# Patient Record
Sex: Female | Born: 1960 | Race: White | Hispanic: No | Marital: Married | Smoking: Former smoker
Health system: Southern US, Community
[De-identification: ages and names within clinical notes are randomized; demographics above are authoritative.]

## PROBLEM LIST (undated history)

## (undated) DIAGNOSIS — N809 Endometriosis, unspecified: Secondary | ICD-10-CM

## (undated) DIAGNOSIS — I10 Essential (primary) hypertension: Secondary | ICD-10-CM

## (undated) DIAGNOSIS — M810 Age-related osteoporosis without current pathological fracture: Secondary | ICD-10-CM

## (undated) DIAGNOSIS — N979 Female infertility, unspecified: Secondary | ICD-10-CM

## (undated) HISTORY — PX: LAPAROSCOPY: SHX197

## (undated) HISTORY — DX: Essential (primary) hypertension: I10

## (undated) HISTORY — DX: Age-related osteoporosis without current pathological fracture: M81.0

## (undated) HISTORY — DX: Endometriosis, unspecified: N80.9

## (undated) HISTORY — DX: Female infertility, unspecified: N97.9

## (undated) HISTORY — PX: TONSILLECTOMY: SUR1361

---

## 2009-07-10 ENCOUNTER — Other Ambulatory Visit: Admission: RE | Admit: 2009-07-10 | Discharge: 2009-07-10 | Payer: Self-pay | Admitting: *Deleted

## 2009-07-17 ENCOUNTER — Encounter: Admission: RE | Admit: 2009-07-17 | Discharge: 2009-07-17 | Payer: Self-pay | Admitting: *Deleted

## 2010-07-17 ENCOUNTER — Encounter: Admission: RE | Admit: 2010-07-17 | Discharge: 2010-07-17 | Payer: Self-pay | Admitting: *Deleted

## 2010-09-17 ENCOUNTER — Other Ambulatory Visit
Admission: RE | Admit: 2010-09-17 | Discharge: 2010-09-17 | Payer: Self-pay | Source: Home / Self Care | Admitting: Internal Medicine

## 2010-11-14 ENCOUNTER — Other Ambulatory Visit: Payer: Self-pay | Admitting: Gastroenterology

## 2010-11-28 ENCOUNTER — Other Ambulatory Visit: Payer: Self-pay | Admitting: Gastroenterology

## 2011-06-10 ENCOUNTER — Other Ambulatory Visit: Payer: Self-pay | Admitting: Family Medicine

## 2011-06-10 DIAGNOSIS — Z1231 Encounter for screening mammogram for malignant neoplasm of breast: Secondary | ICD-10-CM

## 2011-06-28 ENCOUNTER — Other Ambulatory Visit: Payer: Self-pay | Admitting: Gastroenterology

## 2011-07-22 ENCOUNTER — Ambulatory Visit
Admission: RE | Admit: 2011-07-22 | Discharge: 2011-07-22 | Disposition: A | Discharge status: Home / Self Care | Payer: BC Managed Care – PPO | Source: Ambulatory Visit | Attending: Family Medicine | Admitting: Family Medicine

## 2011-07-22 DIAGNOSIS — Z1231 Encounter for screening mammogram for malignant neoplasm of breast: Secondary | ICD-10-CM

## 2012-02-05 DIAGNOSIS — H409 Unspecified glaucoma: Secondary | ICD-10-CM | POA: Insufficient documentation

## 2012-02-05 DIAGNOSIS — D126 Benign neoplasm of colon, unspecified: Secondary | ICD-10-CM | POA: Insufficient documentation

## 2012-06-23 ENCOUNTER — Other Ambulatory Visit: Payer: Self-pay | Admitting: Internal Medicine

## 2012-06-23 ENCOUNTER — Other Ambulatory Visit: Payer: Self-pay | Admitting: Family Medicine

## 2012-06-23 DIAGNOSIS — Z1231 Encounter for screening mammogram for malignant neoplasm of breast: Secondary | ICD-10-CM

## 2012-06-26 ENCOUNTER — Other Ambulatory Visit: Payer: Self-pay | Admitting: Gynecology

## 2012-06-26 DIAGNOSIS — Z780 Asymptomatic menopausal state: Secondary | ICD-10-CM

## 2012-07-22 ENCOUNTER — Ambulatory Visit
Admission: RE | Admit: 2012-07-22 | Discharge: 2012-07-22 | Disposition: A | Discharge status: Home / Self Care | Payer: BC Managed Care – PPO | Source: Ambulatory Visit | Attending: Gynecology | Admitting: Gynecology

## 2012-07-22 ENCOUNTER — Ambulatory Visit
Admission: RE | Admit: 2012-07-22 | Discharge: 2012-07-22 | Disposition: A | Discharge status: Home / Self Care | Payer: BC Managed Care – PPO | Source: Ambulatory Visit | Attending: Internal Medicine | Admitting: Internal Medicine

## 2012-07-22 ENCOUNTER — Ambulatory Visit: Payer: BC Managed Care – PPO

## 2012-07-22 DIAGNOSIS — Z1231 Encounter for screening mammogram for malignant neoplasm of breast: Secondary | ICD-10-CM

## 2012-07-22 DIAGNOSIS — Z780 Asymptomatic menopausal state: Secondary | ICD-10-CM

## 2012-07-27 ENCOUNTER — Other Ambulatory Visit: Payer: Self-pay | Admitting: Internal Medicine

## 2012-07-27 DIAGNOSIS — R928 Other abnormal and inconclusive findings on diagnostic imaging of breast: Secondary | ICD-10-CM

## 2012-07-29 ENCOUNTER — Ambulatory Visit
Admission: RE | Admit: 2012-07-29 | Discharge: 2012-07-29 | Disposition: A | Discharge status: Home / Self Care | Payer: BC Managed Care – PPO | Source: Ambulatory Visit | Attending: Internal Medicine | Admitting: Internal Medicine

## 2012-07-29 DIAGNOSIS — R928 Other abnormal and inconclusive findings on diagnostic imaging of breast: Secondary | ICD-10-CM

## 2012-08-07 DIAGNOSIS — M810 Age-related osteoporosis without current pathological fracture: Secondary | ICD-10-CM | POA: Insufficient documentation

## 2012-08-07 DIAGNOSIS — E785 Hyperlipidemia, unspecified: Secondary | ICD-10-CM | POA: Insufficient documentation

## 2012-12-22 ENCOUNTER — Other Ambulatory Visit: Payer: Self-pay | Admitting: Internal Medicine

## 2012-12-22 DIAGNOSIS — N63 Unspecified lump in unspecified breast: Secondary | ICD-10-CM

## 2012-12-22 DIAGNOSIS — N6489 Other specified disorders of breast: Secondary | ICD-10-CM

## 2012-12-31 ENCOUNTER — Telehealth: Payer: Self-pay | Admitting: Gynecology

## 2012-12-31 NOTE — Telephone Encounter (Signed)
pt having trouble with Fosamax upsetting her stomach/please advise/[place]

## 2013-01-01 NOTE — Telephone Encounter (Signed)
See note below. Please advise.  

## 2013-01-04 NOTE — Telephone Encounter (Signed)
Please pull chart, not in my office

## 2013-01-04 NOTE — Telephone Encounter (Signed)
CHART IN YOUR OFFICE/ SORRY/ SUE

## 2013-01-05 NOTE — Telephone Encounter (Signed)
Patient notified of Dr. Farrel Gobble instructions for vitamin D and Ca. Patient understands and agrees with this. sue

## 2013-01-05 NOTE — Telephone Encounter (Signed)
Reviewed chart, pt on fossamax based on forearm reading, osteopenic in spine and normal in hip, I suggest she discontinue rx and be more compliant with vit D and Ca, we can repeat DEXA in 2y to see if she is stable or loosing ground, as she is postmenopausal for 10y, she should be stable

## 2013-01-05 NOTE — Telephone Encounter (Signed)
Left message to return call to office. sue 

## 2013-01-19 ENCOUNTER — Ambulatory Visit
Admission: RE | Admit: 2013-01-19 | Discharge: 2013-01-19 | Disposition: A | Discharge status: Home / Self Care | Payer: BC Managed Care – PPO | Source: Ambulatory Visit | Attending: Internal Medicine | Admitting: Internal Medicine

## 2013-01-19 ENCOUNTER — Other Ambulatory Visit: Payer: Self-pay | Admitting: Internal Medicine

## 2013-01-19 DIAGNOSIS — N6489 Other specified disorders of breast: Secondary | ICD-10-CM

## 2013-01-19 DIAGNOSIS — N63 Unspecified lump in unspecified breast: Secondary | ICD-10-CM

## 2013-06-17 ENCOUNTER — Other Ambulatory Visit: Payer: Self-pay

## 2013-06-17 DIAGNOSIS — Z1231 Encounter for screening mammogram for malignant neoplasm of breast: Secondary | ICD-10-CM

## 2013-06-28 ENCOUNTER — Other Ambulatory Visit: Payer: Self-pay

## 2013-06-28 MED ORDER — ESTRADIOL 10 MCG VA TABS: 1.0000 | ORAL_TABLET | VAGINAL | Status: DC

## 2013-06-28 NOTE — Telephone Encounter (Signed)
Refill fax came in from [facility] on [address].Pt scheduled aex for 07/12/13. rx sent to pharmacy

## 2013-07-12 ENCOUNTER — Ambulatory Visit: Payer: Self-pay | Admitting: Gynecology

## 2013-07-14 ENCOUNTER — Ambulatory Visit (INDEPENDENT_AMBULATORY_CARE_PROVIDER_SITE_OTHER): Payer: BC Managed Care – PPO | Admitting: Gynecology

## 2013-07-14 ENCOUNTER — Encounter: Payer: Self-pay | Admitting: Gynecology

## 2013-07-14 VITALS — BP 102/60 | HR 68 | Resp 16 | Ht 63.0 in | Wt 153.0 lb

## 2013-07-14 DIAGNOSIS — I10 Essential (primary) hypertension: Secondary | ICD-10-CM | POA: Insufficient documentation

## 2013-07-14 DIAGNOSIS — M810 Age-related osteoporosis without current pathological fracture: Secondary | ICD-10-CM | POA: Insufficient documentation

## 2013-07-14 DIAGNOSIS — Z01419 Encounter for gynecological examination (general) (routine) without abnormal findings: Secondary | ICD-10-CM

## 2013-07-14 DIAGNOSIS — Z0000 Encounter for general adult medical examination without abnormal findings: Secondary | ICD-10-CM

## 2013-07-14 DIAGNOSIS — N952 Postmenopausal atrophic vaginitis: Secondary | ICD-10-CM

## 2013-07-14 LAB — POCT URINALYSIS DIPSTICK
Bilirubin, UA: NEGATIVE
Blood, UA: NEGATIVE
Glucose, UA: NEGATIVE
Ketones, UA: NEGATIVE
Leukocytes, UA: NEGATIVE
Nitrite, UA: NEGATIVE
Protein, UA: NEGATIVE
Urobilinogen, UA: NEGATIVE
pH, UA: 5

## 2013-07-14 MED ORDER — ESTRADIOL 10 MCG VA TABS: 1.0000 | ORAL_TABLET | VAGINAL | Status: DC

## 2013-07-14 NOTE — Progress Notes (Signed)
52 y.o. Married Caucasian female   No obstetric history on file. here for annual exam. Pt reports menses are absent.  She does not report hot flashes, does have night sweats, does have vaginal dryness.  She is using lubricants, Vagifem.  She does not report post-menopasual bleeding.  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  Is using vagifem, works well.  Patient's last menstrual period was 09/30/2002.          Sexually active: yes  The current method of family planning is none.    Exercising: yes  tennis, run, jog, walk, hike, trainer Last pap:06-26-12 neg HPV HR neg Abnormal PAP: none Mammogram :12/2012 u/s rt breast f/u in 10/14 BSE: done monthly Colonoscopy: 2011 DEXA: 2013 Alcohol: 10 a week wine Tobacco: none, smoker in the past Labs: Hgb-12.6, Poct urine-neg  Health Maintenance  Topic Date Due  . Pap Smear  12/04/1977  . Tetanus/tdap  12/05/1978  . Colonoscopy  12/04/2009  . Influenza Vaccine  04/30/2013  . Mammogram  01/20/2015    Family History  Problem Relation Age of Onset  . Hypertension Mother   . Hypertension Father   . Stroke Father   . Hypertension Brother   . Hypertension Brother   . Hypertension Brother     There are no active problems to display for this patient.   Past Medical History  Diagnosis Date  . Hypertension   . Endometriosis   . Infertility, female   . Osteopenia   . Osteoporosis     Past Surgical History  Procedure Laterality Date  . Laparoscopy      Allergies: Review of patient's allergies indicates no known allergies.  Current Outpatient Prescriptions  Medication Sig Dispense Refill  . amLODipine (NORVASC) 2.5 MG tablet daily.       Marland Kitchen CALCIUM PO Take by mouth daily. Plus vitamin d      . Estradiol 10 MCG TABS vaginal tablet Place 1 tablet (10 mcg total) vaginally 2 (two) times a week.  8 tablet  0  . losartan-hydrochlorothiazide (HYZAAR) 100-12.5 MG per  tablet       . Omega-3 Fatty Acids (FISH OIL PO) Take by mouth daily.       . TRANSDERM-SCOP 1.5 MG 1 patch as needed.        No current facility-administered medications for this visit.    ROS: Pertinent items are noted in HPI.  Exam:    BP 102/60  Pulse 68  Resp 16  Ht 5\' 3"  (1.6 m)  Wt 153 lb (69.4 kg)  BMI 27.11 kg/m2  LMP 09/30/2002 Weight change: @WEIGHTCHANGE @ Last 3 height recordings:  Ht Readings from Last 3 Encounters:  07/14/13 5\' 3"  (1.6 m)   General appearance: alert, cooperative and appears stated age Head: Normocephalic, without obvious abnormality, atraumatic Neck: no adenopathy, no carotid bruit, no JVD, supple, symmetrical, trachea midline and thyroid not enlarged, symmetric, no tenderness/mass/nodules Lungs: clear to auscultation bilaterally Breasts: normal appearance, no masses or tenderness Heart: regular rate and rhythm, S1, S2 normal, no murmur, click, rub or gallop Abdomen: soft, non-tender; bowel sounds normal; no masses,  no organomegaly Extremities: extremities normal, atraumatic, no cyanosis or edema Skin: Skin color, texture, turgor normal. No rashes or lesions Lymph nodes: Cervical, supraclavicular, and axillary nodes normal. no inguinal nodes palpated Neurologic: Grossly normal   Pelvic: External genitalia:  no lesions  Urethra: normal appearing urethra with no masses, tenderness or lesions              Bartholins and Skenes: normal                 Vagina: normal appearing vagina with normal color and discharge, no lesions              Cervix: normal appearance              Pap taken: no        Bimanual Exam:  Uterus:  uterus is normal size, shape, consistency and nontender, retroverted with fundal fibroid                                      Adnexa:    normal adnexa in size, nontender and no masses                                      Rectovaginal: Confirms                                      Anus:  normal sphincter tone, no  lesions  A: well woman Osteoporosis Atrophic vaginitis     P: mammogram due 06/2013 pap smear guidelines reviewed, not done today  refill vagifem return annually or prn Discussed PAP guideline changes, importance of weight bearing exercises, calcium, vit D and balanced diet.  An After Visit Summary was printed and given to the patient.

## 2013-07-14 NOTE — Patient Instructions (Signed)

## 2013-07-27 ENCOUNTER — Ambulatory Visit: Payer: BC Managed Care – PPO

## 2013-08-04 ENCOUNTER — Ambulatory Visit
Admission: RE | Admit: 2013-08-04 | Discharge: 2013-08-04 | Disposition: A | Discharge status: Home / Self Care | Payer: BC Managed Care – PPO | Source: Ambulatory Visit

## 2013-08-04 DIAGNOSIS — Z1231 Encounter for screening mammogram for malignant neoplasm of breast: Secondary | ICD-10-CM

## 2013-08-09 ENCOUNTER — Ambulatory Visit: Payer: BC Managed Care – PPO

## 2013-08-19 ENCOUNTER — Ambulatory Visit: Payer: BC Managed Care – PPO

## 2014-05-31 DIAGNOSIS — Z0289 Encounter for other administrative examinations: Secondary | ICD-10-CM

## 2014-07-06 ENCOUNTER — Other Ambulatory Visit: Payer: Self-pay

## 2014-07-06 DIAGNOSIS — Z1239 Encounter for other screening for malignant neoplasm of breast: Secondary | ICD-10-CM

## 2014-07-15 ENCOUNTER — Encounter: Payer: Self-pay | Admitting: Gynecology

## 2014-07-15 ENCOUNTER — Ambulatory Visit (INDEPENDENT_AMBULATORY_CARE_PROVIDER_SITE_OTHER): Payer: BC Managed Care – PPO | Admitting: Gynecology

## 2014-07-15 VITALS — BP 120/64 | Resp 16 | Ht 63.0 in | Wt 156.0 lb

## 2014-07-15 DIAGNOSIS — Z13220 Encounter for screening for lipoid disorders: Secondary | ICD-10-CM

## 2014-07-15 DIAGNOSIS — N952 Postmenopausal atrophic vaginitis: Secondary | ICD-10-CM

## 2014-07-15 DIAGNOSIS — Z0000 Encounter for general adult medical examination without abnormal findings: Secondary | ICD-10-CM

## 2014-07-15 DIAGNOSIS — I10 Essential (primary) hypertension: Secondary | ICD-10-CM

## 2014-07-15 DIAGNOSIS — Z01419 Encounter for gynecological examination (general) (routine) without abnormal findings: Secondary | ICD-10-CM

## 2014-07-15 LAB — POCT URINALYSIS DIPSTICK
Leukocytes, UA: NEGATIVE
Urobilinogen, UA: NEGATIVE
pH, UA: 5

## 2014-07-15 NOTE — Progress Notes (Signed)
53 y.o. Married Caucasian female   G0P0000 here for annual exam. Pt reports menses are absent due to Menopause.  She does not report hot flashes, does have night sweats, does have vaginal dryness.  She is using lubricants, Replens .  She does not report post-menopasual bleeding.    Patient's last menstrual period was 09/30/2002.          Sexually active: Yes.    The current method of family planning is post menopausal status.    Exercising: No.  The patient does not participate in regular exercise at present. Last pap: 06/26/12 NEG HR HPV not indicated. Abnormal PAP: no Mammogram: 08/04/13 ; scheduled for 08/05/14  BSE: yes  Colonoscopy: 3 years ago- dysplastic polyp f/u in 3 years  DEXA: 2014; 2013 per patient  Alcohol: Tobacco: wine couple x's/wk   Urine: Negative  Health Maintenance  Topic Date Due  . Tetanus/tdap  12/05/1978  . Colonoscopy  12/04/2009  . Influenza Vaccine  04/30/2014  . Mammogram  08/05/2015  . Pap Smear  07/14/2016    Family History  Problem Relation Age of Onset  . Hypertension Mother   . Hypertension Father   . Stroke Father   . Hypertension Brother   . Hypertension Brother   . Hypertension Brother     Patient Active Problem List   Diagnosis Date Noted  . Essential hypertension, benign 07/14/2013  . Osteoporosis, unspecified 07/14/2013    Past Medical History  Diagnosis Date  . Hypertension   . Endometriosis   . Infertility, female   . Osteopenia   . Osteoporosis     Past Surgical History  Procedure Laterality Date  . Laparoscopy      Allergies: Review of patient's allergies indicates no known allergies.  Current Outpatient Prescriptions  Medication Sig Dispense Refill  . amLODipine (NORVASC) 2.5 MG tablet daily.       Marland Kitchen CALCIUM PO Take by mouth daily. Plus vitamin d      . losartan-hydrochlorothiazide (HYZAAR) 100-12.5 MG per tablet       . Omega-3 Fatty Acids (FISH OIL PO) Take by mouth daily.       . TRANSDERM-SCOP 1.5 MG 1  patch as needed.       . Estradiol 10 MCG TABS vaginal tablet Place 1 tablet (10 mcg total) vaginally 2 (two) times a week.  24 tablet  3   No current facility-administered medications for this visit.    ROS: per HPI  Exam:    BP 120/64  Resp 16  Ht 5\' 3"  (1.6 m)  Wt 156 lb (70.761 kg)  BMI 27.64 kg/m2  LMP 09/30/2002 Weight change: @WEIGHTCHANGE @ Last 3 height recordings:  Ht Readings from Last 3 Encounters:  07/15/14 5\' 3"  (1.6 m)  07/14/13 5\' 3"  (1.6 m)   General appearance: alert, cooperative and appears stated age Head: Normocephalic, without obvious abnormality, atraumatic Neck: no adenopathy, no carotid bruit, no JVD, supple, symmetrical, trachea midline and thyroid not enlarged, symmetric, no tenderness/mass/nodules Lungs: clear to auscultation bilaterally Breasts: Inspection negative, No nipple retraction or dimpling, No nipple discharge or bleeding, No axillary or supraclavicular adenopathy, Normal to palpation without dominant masses Heart: regular rate and rhythm, S1, S2 normal, no murmur, click, rub or gallop Abdomen: soft, non-tender; bowel sounds normal; no masses,  no organomegaly Extremities: extremities normal, atraumatic, no cyanosis or edema Skin: Skin color, texture, turgor normal. No rashes or lesions Lymph nodes: Cervical, supraclavicular, and axillary nodes normal. no inguinal nodes palpated Neurologic: Grossly normal  Pelvic: External genitalia:  no lesions              Urethra: normal appearing urethra with no masses, tenderness or lesions              Bartholins and Skenes: Bartholin's, Urethra, Skene's normal                 Vagina: normal appearing vagina with normal color and discharge, no lesions              Cervix: normal appearance              Pap taken: No.        Bimanual Exam:  Uterus:  uterus is normal size, shape, consistency and nontender                                      Adnexa:    normal adnexa in size, nontender and no masses                                       Rectovaginal: Confirms                                      Anus:  normal sphincter tone, no lesions      1. Laboratory exam ordered as part of routine general medical examination  - POCT Urinalysis Dipstick - CBC; Future  2. Encounter for routine gynecological examination mammogram counseled on breast self exam, mammography screening, osteoporosis, adequate intake of calcium and vitamin D, diet and exercise return annually or prn Discussed PAP guideline changes, importance of weight bearing exercises, calcium, vit D and balanced diet.  3. Atrophic vaginitis Pt inconsistent with vagifem, might not be adequately primed enough to absorb, at this time prefers to continue with cocoanut oil  4. Essential hypertension, benign Prefers labs here so can discuss with PCP at ov - Comprehensive metabolic panel; Future  5. Screening cholesterol level  - Lipid panel; Future  An After Visit Summary was printed and given to the patient.

## 2014-07-18 ENCOUNTER — Other Ambulatory Visit (INDEPENDENT_AMBULATORY_CARE_PROVIDER_SITE_OTHER): Payer: BC Managed Care – PPO

## 2014-07-18 DIAGNOSIS — Z13220 Encounter for screening for lipoid disorders: Secondary | ICD-10-CM

## 2014-07-18 DIAGNOSIS — I10 Essential (primary) hypertension: Secondary | ICD-10-CM

## 2014-07-18 DIAGNOSIS — Z0000 Encounter for general adult medical examination without abnormal findings: Secondary | ICD-10-CM

## 2014-07-18 LAB — COMPREHENSIVE METABOLIC PANEL
ALT: 19 U/L (ref 0–35)
AST: 21 U/L (ref 0–37)
Albumin: 4.8 g/dL (ref 3.5–5.2)
Alkaline Phosphatase: 53 U/L (ref 39–117)
BUN: 12 mg/dL (ref 6–23)
CHLORIDE: 104 meq/L (ref 96–112)
CO2: 27 mEq/L (ref 19–32)
Calcium: 9.4 mg/dL (ref 8.4–10.5)
Creat: 0.71 mg/dL (ref 0.50–1.10)
Glucose, Bld: 94 mg/dL (ref 70–99)
Potassium: 4.3 mEq/L (ref 3.5–5.3)
Sodium: 141 mEq/L (ref 135–145)
Total Bilirubin: 0.5 mg/dL (ref 0.2–1.2)
Total Protein: 6.8 g/dL (ref 6.0–8.3)

## 2014-07-18 LAB — CBC
HCT: 39.8 % (ref 36.0–46.0)
Hemoglobin: 13.7 g/dL (ref 12.0–15.0)
MCH: 32.1 pg (ref 26.0–34.0)
MCHC: 34.4 g/dL (ref 30.0–36.0)
MCV: 93.2 fL (ref 78.0–100.0)
Platelets: 228 10*3/uL (ref 150–400)
RBC: 4.27 MIL/uL (ref 3.87–5.11)
RDW: 12.4 % (ref 11.5–15.5)
WBC: 3.9 10*3/uL — ABNORMAL LOW (ref 4.0–10.5)

## 2014-07-18 LAB — LIPID PANEL
CHOL/HDL RATIO: 2.8 ratio
Cholesterol: 270 mg/dL — ABNORMAL HIGH (ref 0–200)
HDL: 98 mg/dL (ref >39)
LDL Cholesterol: 149 mg/dL — ABNORMAL HIGH (ref 0–99)
Triglycerides: 115 mg/dL (ref <150)
VLDL: 23 mg/dL (ref 0–40)

## 2014-07-20 ENCOUNTER — Telehealth: Payer: Self-pay | Admitting: *Deleted

## 2014-07-20 NOTE — Telephone Encounter (Signed)
Patient notified see result note 

## 2014-07-20 NOTE — Telephone Encounter (Signed)
Message copied by Alfonzo Feller on Wed Jul 20, 2014  9:54 AM ------      Message from: Elveria Rising      Created: Wed Jul 20, 2014  7:14 AM       Labs done for PCP, pls see if pt needs printed copy.      Cholesterol is high-total and LDL, HDL is great, ratio is good ------

## 2014-07-20 NOTE — Telephone Encounter (Signed)
Pt called Monique Brady back

## 2014-07-20 NOTE — Telephone Encounter (Signed)
Left Message To Call Back  

## 2014-08-01 ENCOUNTER — Encounter: Payer: Self-pay | Admitting: Gynecology

## 2014-08-05 ENCOUNTER — Ambulatory Visit
Admission: RE | Admit: 2014-08-05 | Discharge: 2014-08-05 | Disposition: A | Discharge status: Home / Self Care | Payer: BC Managed Care – PPO | Source: Ambulatory Visit

## 2014-08-05 ENCOUNTER — Other Ambulatory Visit: Payer: Self-pay

## 2014-08-05 DIAGNOSIS — Z1231 Encounter for screening mammogram for malignant neoplasm of breast: Secondary | ICD-10-CM

## 2014-08-30 ENCOUNTER — Other Ambulatory Visit: Payer: Self-pay | Admitting: Internal Medicine

## 2014-08-30 DIAGNOSIS — M810 Age-related osteoporosis without current pathological fracture: Secondary | ICD-10-CM

## 2014-10-06 ENCOUNTER — Ambulatory Visit
Admission: RE | Admit: 2014-10-06 | Discharge: 2014-10-06 | Disposition: A | Discharge status: Home / Self Care | Payer: Self-pay | Source: Ambulatory Visit | Attending: Internal Medicine | Admitting: Internal Medicine

## 2014-10-06 DIAGNOSIS — M810 Age-related osteoporosis without current pathological fracture: Secondary | ICD-10-CM

## 2014-10-07 ENCOUNTER — Other Ambulatory Visit: Payer: BC Managed Care – PPO

## 2014-10-19 ENCOUNTER — Other Ambulatory Visit: Payer: Self-pay | Admitting: Gastroenterology

## 2015-07-04 ENCOUNTER — Other Ambulatory Visit: Payer: Self-pay

## 2015-07-04 DIAGNOSIS — Z1231 Encounter for screening mammogram for malignant neoplasm of breast: Secondary | ICD-10-CM

## 2015-08-07 ENCOUNTER — Ambulatory Visit
Admission: RE | Admit: 2015-08-07 | Discharge: 2015-08-07 | Disposition: A | Discharge status: Home / Self Care | Payer: BLUE CROSS/BLUE SHIELD | Source: Ambulatory Visit

## 2015-08-07 DIAGNOSIS — Z1231 Encounter for screening mammogram for malignant neoplasm of breast: Secondary | ICD-10-CM

## 2015-08-17 ENCOUNTER — Ambulatory Visit (INDEPENDENT_AMBULATORY_CARE_PROVIDER_SITE_OTHER): Payer: BLUE CROSS/BLUE SHIELD | Admitting: Obstetrics and Gynecology

## 2015-08-17 ENCOUNTER — Encounter: Payer: Self-pay | Admitting: Obstetrics and Gynecology

## 2015-08-17 VITALS — BP 136/74 | HR 80 | Resp 14 | Ht 63.0 in | Wt 159.0 lb

## 2015-08-17 DIAGNOSIS — Z124 Encounter for screening for malignant neoplasm of cervix: Secondary | ICD-10-CM | POA: Diagnosis not present

## 2015-08-17 DIAGNOSIS — Z01419 Encounter for gynecological examination (general) (routine) without abnormal findings: Secondary | ICD-10-CM

## 2015-08-17 DIAGNOSIS — M810 Age-related osteoporosis without current pathological fracture: Secondary | ICD-10-CM | POA: Diagnosis not present

## 2015-08-17 DIAGNOSIS — N952 Postmenopausal atrophic vaginitis: Secondary | ICD-10-CM | POA: Diagnosis not present

## 2015-08-17 MED ORDER — ESTRADIOL 10 MCG VA TABS: ORAL_TABLET | VAGINAL | Status: DC

## 2015-08-17 NOTE — Patient Instructions (Signed)

## 2015-08-17 NOTE — Progress Notes (Signed)
Patient ID: Monique Brady, female   DOB: January 17, 1961, 54 y.o.   MRN: [id number] 54 y.o. G0P0000 MarriedCaucasianF here for annual exam.  Postmenopausal, no bleeding. Sexually active, no longer using vagifem, wants to restart it. She does have some burning with intercourse even with lubrication.  The patient has osteoporosis, restarted on medication this year (had been on fosomax in the past). Labs with primary MD.    Patient's last menstrual period was 09/30/2002.          Sexually active: Yes.    The current method of family planning is post menopausal status.    Exercising: Yes.    run, walk, weight training and tennis Smoker: former  Health Maintenance: Pap:  09-17-10 WNL  History of abnormal Pap:  no MMG:  08-17-15 WNL Colonoscopy:  2015 repeat in 5 years  BMD:   10-06-14 osteoporosis (followed by primary) TDaP:  Unsure, she will check with her primary Gardasil: N/A   reports that she has quit smoking. She has never used smokeless tobacco. She reports that she drinks about 4.8 oz of alcohol per week. She reports that she does not use illicit drugs.2 adopted daughters from [place], traveled a lot with her husbands work, has lived in several different countries.  Daughters are 82 and 32, both in college.   Past Medical History  Diagnosis Date  . Hypertension   . Endometriosis   . Infertility, female   . Osteopenia   . Osteoporosis     Past Surgical History  Procedure Laterality Date  . Laparoscopy      Current Outpatient Prescriptions  Medication Sig Dispense Refill  . amLODipine (NORVASC) 2.5 MG tablet daily.     Marland Kitchen CALCIUM PO Take by mouth daily. Plus vitamin d    . Estradiol 10 MCG TABS vaginal tablet Place 1 tablet (10 mcg total) vaginally 2 (two) times a week. 24 tablet 3  . losartan-hydrochlorothiazide (HYZAAR) 100-12.5 MG per tablet     . Omega-3 Fatty Acids (FISH OIL PO) Take by mouth daily.     . TRANSDERM-SCOP 1.5 MG 1 patch as needed.      No current  facility-administered medications for this visit.    Family History  Problem Relation Age of Onset  . Hypertension Mother   . Hypertension Father   . Stroke Father   . Hypertension Brother   . Hypertension Brother   . Hypertension Brother     Review of Systems  Constitutional: Negative.   HENT: Negative.   Eyes: Negative.   Respiratory: Negative.   Cardiovascular: Negative.   Gastrointestinal: Negative.   Endocrine: Negative.   Genitourinary: Negative.        Vaginal dryness   Musculoskeletal: Negative.   Skin: Negative.   Allergic/Immunologic: Negative.   Neurological: Negative.   Psychiatric/Behavioral: Negative.     Exam:   BP 136/74 mmHg  Pulse 80  Resp 14  Ht 5\' 3"  (1.6 m)  Wt 159 lb (72.122 kg)  BMI 28.17 kg/m2  LMP 09/30/2002  Weight change: @WEIGHTCHANGE @ Height:   Height: 5\' 3"  (160 cm)  Ht Readings from Last 3 Encounters:  08/17/15 5\' 3"  (1.6 m)  07/15/14 5\' 3"  (1.6 m)  07/14/13 5\' 3"  (1.6 m)    General appearance: alert, cooperative and appears stated age Head: Normocephalic, without obvious abnormality, atraumatic Neck: no adenopathy, supple, symmetrical, trachea midline and thyroid normal to inspection and palpation Lungs: clear to auscultation bilaterally Breasts: normal appearance, no masses or tenderness Heart: regular rate and  rhythm Abdomen: soft, non-tender; bowel sounds normal; no masses,  no organomegaly Extremities: extremities normal, atraumatic, no cyanosis or edema Skin: Skin color, texture, turgor normal. No rashes or lesions Lymph nodes: Cervical, supraclavicular, and axillary nodes normal. No abnormal inguinal nodes palpated Neurologic: Grossly normal   Pelvic: External genitalia:  no lesions              Urethra:  normal appearing urethra with no masses, tenderness or lesions              Bartholins and Skenes: normal                 Vagina: normal appearing vagina with normal color and discharge, no lesions. Atrophic.               Cervix: no lesions               Bimanual Exam:  Uterus:  normal size, contour, position, consistency, mobility, non-tender              Adnexa: no mass, fullness, tenderness               Rectovaginal: Confirms               Anus:  normal sphincter tone, no lesions  Chaperone was present for exam.  A:  Well Woman with normal exam  Vaginal atrophy with some dyspareunia  Osteoporosis, on medication, she will call with what she is on  P:   Pap with hpv testing  Mammogram, just done at the [facility] center  Labs with primary  DEXA and colonoscopy UTD  Discussed calcium and vit d  Discussed breast self exam

## 2015-08-21 LAB — IPS PAP TEST WITH HPV

## 2015-10-12 ENCOUNTER — Encounter (HOSPITAL_COMMUNITY): Payer: Self-pay | Admitting: *Deleted

## 2015-10-12 ENCOUNTER — Emergency Department (HOSPITAL_COMMUNITY): Payer: BLUE CROSS/BLUE SHIELD

## 2015-10-12 ENCOUNTER — Emergency Department (HOSPITAL_COMMUNITY)
Admission: EM | Admit: 2015-10-12 | Discharge: 2015-10-12 | Disposition: A | Discharge status: Home / Self Care | Payer: BLUE CROSS/BLUE SHIELD | Attending: Emergency Medicine | Admitting: Emergency Medicine

## 2015-10-12 DIAGNOSIS — S3992XA Unspecified injury of lower back, initial encounter: Secondary | ICD-10-CM | POA: Diagnosis present

## 2015-10-12 DIAGNOSIS — M810 Age-related osteoporosis without current pathological fracture: Secondary | ICD-10-CM | POA: Diagnosis not present

## 2015-10-12 DIAGNOSIS — I10 Essential (primary) hypertension: Secondary | ICD-10-CM | POA: Insufficient documentation

## 2015-10-12 DIAGNOSIS — Y9301 Activity, walking, marching and hiking: Secondary | ICD-10-CM | POA: Insufficient documentation

## 2015-10-12 DIAGNOSIS — Y9289 Other specified places as the place of occurrence of the external cause: Secondary | ICD-10-CM | POA: Insufficient documentation

## 2015-10-12 DIAGNOSIS — Y999 Unspecified external cause status: Secondary | ICD-10-CM | POA: Diagnosis not present

## 2015-10-12 DIAGNOSIS — Z79899 Other long term (current) drug therapy: Secondary | ICD-10-CM | POA: Diagnosis not present

## 2015-10-12 DIAGNOSIS — Z87891 Personal history of nicotine dependence: Secondary | ICD-10-CM | POA: Insufficient documentation

## 2015-10-12 DIAGNOSIS — S30810A Abrasion of lower back and pelvis, initial encounter: Secondary | ICD-10-CM | POA: Insufficient documentation

## 2015-10-12 DIAGNOSIS — W109XXA Fall (on) (from) unspecified stairs and steps, initial encounter: Secondary | ICD-10-CM | POA: Diagnosis not present

## 2015-10-12 DIAGNOSIS — Z8742 Personal history of other diseases of the female genital tract: Secondary | ICD-10-CM | POA: Diagnosis not present

## 2015-10-12 DIAGNOSIS — M545 Low back pain: Secondary | ICD-10-CM

## 2015-10-12 MED ORDER — ONDANSETRON HCL 4 MG PO TABS
4.0000 mg | ORAL_TABLET | Freq: Once | ORAL | Status: AC
  Administered 2015-10-12: 4 mg via ORAL
  Filled 2015-10-12: qty 1

## 2015-10-12 MED ORDER — HYDROCODONE-ACETAMINOPHEN 5-325 MG PO TABS
1.0000 | ORAL_TABLET | Freq: Once | ORAL | Status: AC
  Administered 2015-10-12: 1 via ORAL
  Filled 2015-10-12: qty 1

## 2015-10-12 MED ORDER — CYCLOBENZAPRINE HCL 10 MG PO TABS: 10.0000 mg | ORAL_TABLET | Freq: Two times a day (BID) | ORAL | Status: DC | PRN

## 2015-10-12 NOTE — Discharge Instructions (Signed)
Please read and follow all provided instructions.  Your diagnoses today include:  1. Low back pain, unspecified back pain laterality, with sciatica presence unspecified   2. Abrasion of lower back, initial encounter    Tests performed today include:  Vital signs - see below for your results today  Medications prescribed:   Take any prescribed medications only as directed.  Home care instructions:   Follow any educational materials contained in this packet  Please rest, use ice or heat on your back for the next several days  Do not lift, push, pull anything more than 10 pounds for the next week  Follow-up instructions: Please follow-up with your primary care provider in the next 1 week for further evaluation of your symptoms.   Return instructions:  SEEK IMMEDIATE MEDICAL ATTENTION IF YOU HAVE:  New numbness, tingling, weakness, or problem with the use of your arms or legs  Severe back pain not relieved with medications  Loss control of your bowels or bladder  Increasing pain in any areas of the body (such as chest or abdominal pain)  Shortness of breath, dizziness, or fainting.   Worsening nausea (feeling sick to your stomach), vomiting, fever, or sweats  Any other emergent concerns regarding your health   Additional Information:  Your vital signs today were: BP 126/85 mmHg   Pulse 76   Temp(Src) 98.2 F (36.8 C) (Oral)   Resp 20   SpO2 100%   LMP 09/30/2002 If your blood pressure (BP) was elevated above 135/85 this visit, please have this repeated by your doctor within one month. --------------

## 2015-10-12 NOTE — ED Provider Notes (Signed)
CSN: [id number]     Arrival date & time 10/12/15  [id number] History   First MD Initiated Contact with Patient 10/12/15 0802     Chief Complaint  Patient presents with  . Fall   (Consider location/radiation/quality/duration/timing/severity/associated sxs/prior Treatment) HPI 55 y.o. female with pmh HTN, presents to the Emergency Department today complaining of low back pain after a fall last night. States that she was walking down the stairs when she slipped and landed on her lumbar spine. She did no hit her head/No LOC. She was able to ambulate afterwards. This morning she took some advil due to continued pain and also felt a little nauseated and diaphoretic. Decided to come to the ED to be evaluated. No N/V, no fevers, no abd pain, no dyspnea, no loss of bowel or bladder function, no headache.   No blood thinners  Past Medical History  Diagnosis Date  . Hypertension   . Endometriosis   . Infertility, female   . Osteopenia   . Osteoporosis    Past Surgical History  Procedure Laterality Date  . Laparoscopy     Family History  Problem Relation Age of Onset  . Hypertension Mother   . Hypertension Father   . Stroke Father   . Hypertension Brother   . Hypertension Brother   . Hypertension Brother    Social History  Substance Use Topics  . Smoking status: Former Research scientist (life sciences)  . Smokeless tobacco: Never Used  . Alcohol Use: 4.8 oz/week    8 Glasses of wine per week   OB History    Gravida Para Term Preterm AB TAB SAB Ectopic Multiple Living   0 0 0 0 0 0 0 0 0 0       Obstetric Comments   2 adopted girls     Review of Systems 10 Systems reviewed and all are negative for acute change except as noted in the HPI.  Allergies  Review of patient's allergies indicates no known allergies.  Home Medications   Prior to Admission medications   Medication Sig Start Date End Date Taking? Authorizing Provider  amLODipine (NORVASC) 2.5 MG tablet daily.  06/13/13   Historical Provider, MD   CALCIUM PO Take by mouth daily. Plus vitamin d    Historical Provider, MD  Estradiol 10 MCG TABS vaginal tablet 1 tab per vagina every night x 1 week, then 2 x a week at hs 08/17/15   Salvadore Dom, MD  losartan-hydrochlorothiazide [facility]) 100-12.5 MG per tablet  06/13/13   Historical Provider, MD  Omega-3 Fatty Acids (FISH OIL PO) Take by mouth daily.     Historical Provider, MD  TRANSDERM-SCOP 1.5 MG 1 patch as needed.  07/05/13   Historical Provider, MD   BP 126/85 mmHg  Pulse 76  Temp(Src) 98.2 F (36.8 C) (Oral)  Resp 20  SpO2 100%  LMP 09/30/2002   Physical Exam  Constitutional: She is oriented to person, place, and time. She appears well-developed and well-nourished.  HENT:  Head: Normocephalic and atraumatic.  Eyes: EOM are normal.  Cardiovascular: Normal rate and regular rhythm.   Pulmonary/Chest: Effort normal.  Abdominal: Soft.  Musculoskeletal: Normal range of motion.       Cervical back: Normal.       Thoracic back: Normal.       Lumbar back: She exhibits tenderness and pain. She exhibits normal range of motion and no swelling.  Tenderness on palpation of Lumbar spine. Full ROM. Abrasion noted over L4-L5.   Neurological: She is  alert and oriented to person, place, and time.  Skin: Skin is warm and dry.  Psychiatric: She has a normal mood and affect. Her behavior is normal. Thought content normal.  Nursing note and vitals reviewed.  ED Course  Procedures (including critical care time) Labs Review Labs Reviewed - No data to display  Imaging Review Dg Lumbar Spine Complete  10/12/2015  CLINICAL DATA:  Back pain, fell on ice last night, sharp RIGHT-side lower back pain and RIGHT pelvic pain EXAM: LUMBAR SPINE - COMPLETE 4+ VIEW COMPARISON:  None FINDINGS: Five non-rib-bearing lumbar vertebra. Osseous mineralization normal for technique. Vertebral body and disc space heights maintained. No acute fracture, subluxation or bone destruction. No spondylolysis. SI  joints symmetric. IMPRESSION: No acute osseous abnormalities. Electronically Signed   By: Lavonia Dana M.D.   On: 10/12/2015 08:43   I have personally reviewed and evaluated these images and lab results as part of my medical decision-making.   EKG Interpretation None      MDM  I have reviewed the relevant previous healthcare records. I obtained HPI from historian.  ED Course: Xray Lumbar  Assessment: 54yF presents with low back pain since last night after a fall. No head trauma/LOC. Able to ambulate afterwards. Some N, but no V, no fever, no h/a, no loss of bowel or bladder. On exam, there is an abrasion located on her lower back as well as some tenderness on palpation of L spine. XR of Lumbar spine negative. VS Stable. Pt in NAD. Able to Ambulate.  No neurological deficits appreciated. Patient is ambulatory. No warning symptoms of back pain including: -Fecal incontinence  -Urinary retention or overflow incontinence  -Night sweats -Waking from sleep with back pain  -Unexplained fevers or weight loss  -Hx cancer -IVDU -Recent trauma  No concern for cauda equina, epidural abscess, or other serious cause of back pain. Conservative measures such as rest, ice/heat and pain medicine indicated with PCP follow-up if no improvement with conservative management.   Disposition/Plan:  DC Home Additional Verbal discharge instructions given and discussed with patient.  Pt Instructed to f/u with PCP in the next 48 hours for evaluation and treatment of symptoms. Return precautions given Pt acknowledges and agrees with plan  Supervising Physician Deno Etienne, DO   Final diagnoses:  Low back pain, unspecified back pain laterality, with sciatica presence unspecified  Abrasion of lower back, initial encounter       Shary Decamp, PA-C 10/12/15 [place], DO 10/12/15 1346

## 2015-10-12 NOTE — ED Notes (Signed)
Declined W/C at D/C and was escorted to lobby by RN. 

## 2015-10-12 NOTE — ED Notes (Signed)
Pt states that she slipped on her step last night and landed with her back against a step. Pt noted to have a bruise to the back. Pt able to bear weight. Denies bowel or bladder changes. Pt with sensation intact as well.

## 2016-07-08 ENCOUNTER — Other Ambulatory Visit: Payer: Self-pay | Admitting: Internal Medicine

## 2016-07-08 DIAGNOSIS — Z1231 Encounter for screening mammogram for malignant neoplasm of breast: Secondary | ICD-10-CM

## 2016-08-08 ENCOUNTER — Ambulatory Visit
Admission: RE | Admit: 2016-08-08 | Discharge: 2016-08-08 | Disposition: A | Discharge status: Home / Self Care | Payer: BLUE CROSS/BLUE SHIELD | Source: Ambulatory Visit | Attending: Internal Medicine | Admitting: Internal Medicine

## 2016-08-08 DIAGNOSIS — Z1231 Encounter for screening mammogram for malignant neoplasm of breast: Secondary | ICD-10-CM

## 2016-08-09 DIAGNOSIS — H40003 Preglaucoma, unspecified, bilateral: Secondary | ICD-10-CM | POA: Diagnosis not present

## 2016-08-19 ENCOUNTER — Ambulatory Visit: Payer: BLUE CROSS/BLUE SHIELD | Admitting: Obstetrics and Gynecology

## 2016-08-29 ENCOUNTER — Encounter: Payer: Self-pay | Admitting: Obstetrics and Gynecology

## 2016-08-29 ENCOUNTER — Ambulatory Visit (INDEPENDENT_AMBULATORY_CARE_PROVIDER_SITE_OTHER): Payer: BLUE CROSS/BLUE SHIELD | Admitting: Obstetrics and Gynecology

## 2016-08-29 VITALS — BP 120/80 | HR 64 | Resp 12 | Ht 63.0 in | Wt 162.0 lb

## 2016-08-29 DIAGNOSIS — N6459 Other signs and symptoms in breast: Secondary | ICD-10-CM | POA: Diagnosis not present

## 2016-08-29 DIAGNOSIS — I10 Essential (primary) hypertension: Secondary | ICD-10-CM | POA: Diagnosis not present

## 2016-08-29 DIAGNOSIS — R8299 Other abnormal findings in urine: Secondary | ICD-10-CM | POA: Diagnosis not present

## 2016-08-29 DIAGNOSIS — Z01419 Encounter for gynecological examination (general) (routine) without abnormal findings: Secondary | ICD-10-CM

## 2016-08-29 DIAGNOSIS — M810 Age-related osteoporosis without current pathological fracture: Secondary | ICD-10-CM | POA: Diagnosis not present

## 2016-08-29 DIAGNOSIS — Z23 Encounter for immunization: Secondary | ICD-10-CM

## 2016-08-29 DIAGNOSIS — N390 Urinary tract infection, site not specified: Secondary | ICD-10-CM | POA: Diagnosis not present

## 2016-08-29 NOTE — Progress Notes (Signed)
Scheduled patient while in office for left breast diagnostic mamm[facility] on 09/04/2016 at 2:50 pm. Patient is agreeable to date and time. Placed in mammogram hold.

## 2016-08-29 NOTE — Progress Notes (Signed)
55 y.o. G0P0000 MarriedCaucasianF here for annual exam.  She is struggling with plantar fascitis in her left foot. No vaginal bleeding. She is sexually active, hasn't been using the vagifem and doing fine with lubricant. She will call if she needs vagifem.     Patient's last menstrual period was 09/30/2002.          Sexually active: Yes.    The current method of family planning is post menopausal status.    Exercising: Yes.    Trainer, tennis, walking, spin classes Smoker:  no  Health Maintenance: Pap:  08/17/15 Neg. HR HPV:neg  History of abnormal Pap:  no MMG:  08/12/16 BIRADS1:Neg  Colonoscopy:  2015. F/u 5 years  BMD:   10/06/14 Osteoporosis  TDaP:  Unsure, thinks due.   reports that she has quit smoking. She has never used smokeless tobacco. She reports that she drinks about 4.8 oz of alcohol per week . She reports that she does not use drugs. 2 adopted daughters from [place], traveled a lot with her husbands work, has lived in several different countries.  Daughters are 39 and 87.   Past Medical History:  Diagnosis Date  . Endometriosis   . Hypertension   . Infertility, female   . Osteopenia   . Osteoporosis     Past Surgical History:  Procedure Laterality Date  . LAPAROSCOPY      Current Outpatient Prescriptions  Medication Sig Dispense Refill  . amLODipine (NORVASC) 2.5 MG tablet daily.     . Estradiol 10 MCG TABS vaginal tablet 1 tab per vagina every night x 1 week, then 2 x a week at hs 24 tablet 3  . losartan-hydrochlorothiazide (HYZAAR) 100-12.5 MG per tablet     . risedronate (ACTONEL) 35 MG tablet     . TRANSDERM-SCOP 1.5 MG 1 patch as needed.     . zolpidem (AMBIEN) 10 MG tablet      No current facility-administered medications for this visit.   On actonel  Family History  Problem Relation Age of Onset  . Hypertension Mother   . Hypertension Father   . Stroke Father   . Hypertension Brother   . Hypertension Brother   . Hypertension Brother     Review  of Systems  Constitutional: Negative.   HENT: Negative.   Eyes: Negative.   Respiratory: Negative.   Cardiovascular: Negative.   Gastrointestinal: Negative.   Endocrine: Negative.   Genitourinary: Negative.   Musculoskeletal: Negative.        Patient states L breast nipple inverts during the night.   Skin: Negative.   Allergic/Immunologic: Negative.   Neurological: Negative.   Hematological: Negative.   Psychiatric/Behavioral: Negative.   No changes in her breast while sitting. For 1+ years she has noticed her left nipple inverts when she is sleeping. No change.  Exam:   BP 120/80 (BP Location: Right Arm, Patient Position: Sitting, Cuff Size: Normal)   Pulse 64   Resp 12   Ht 5\' 3"  (1.6 m)   Wt 162 lb (73.5 kg)   LMP 09/30/2002   BMI 28.70 kg/m   Weight change: @WEIGHTCHANGE @ Height:   Height: 5\' 3"  (160 cm)  Ht Readings from Last 3 Encounters:  08/29/16 5\' 3"  (1.6 m)  08/17/15 5\' 3"  (1.6 m)  07/15/14 5\' 3"  (1.6 m)    General appearance: alert, cooperative and appears stated age Head: Normocephalic, without obvious abnormality, atraumatic Neck: no adenopathy, supple, symmetrical, trachea midline and thyroid normal to inspection and palpation Lungs:  clear to auscultation bilaterally Breasts: normal appearance, no masses or tenderness, left nipple inverted, new finding Heart: regular rate and rhythm Abdomen: soft, non-tender; bowel sounds normal; no masses,  no organomegaly Extremities: extremities normal, atraumatic, no cyanosis or edema Skin: Skin color, texture, turgor normal. No rashes or lesions Lymph nodes: Cervical, supraclavicular, and axillary nodes normal. No abnormal inguinal nodes palpated Neurologic: Grossly normal   Pelvic: External genitalia:  no lesions              Urethra:  normal appearing urethra with no masses, tenderness or lesions              Bartholins and Skenes: normal                 Vagina: normal appearing vagina with normal color and  discharge, no lesions              Cervix: no lesions               Bimanual Exam:  Uterus:  normal size, contour, position, consistency, mobility, non-tender              Adnexa: no mass, fullness, tenderness               Rectovaginal: Confirms               Anus:  normal sphincter tone, no lesions  Chaperone was present for exam.  A:  Well Woman with normal exam  Osteoporosis, on Actonel, following with he primary  Left nipple inverted, new finding  P:   TDAP  Labs with primary MD  Mammogram just done, category c density. Will get further imaging  Colonoscopy UTD  No pap this year  Discussed breast self exam  Discussed calcium and vit D intake

## 2016-09-01 ENCOUNTER — Other Ambulatory Visit: Payer: Self-pay | Admitting: Obstetrics and Gynecology

## 2016-09-01 DIAGNOSIS — N952 Postmenopausal atrophic vaginitis: Secondary | ICD-10-CM

## 2016-09-02 NOTE — Telephone Encounter (Signed)
Medication refill request: Yuvafem Last AEX:  08/29/16 JJ Next AEX: 09/01/17 JJ Last MMG (if hormonal medication request): 08/18/16 BIRADS1  Refill authorized: 08/17/15 /324 tablets 3R. Please advise. Thank you.

## 2016-09-02 NOTE — Telephone Encounter (Signed)
Patient was just seen and isn't using the vagifem. She will call if she wants it.

## 2016-09-04 ENCOUNTER — Ambulatory Visit
Admission: RE | Admit: 2016-09-04 | Discharge: 2016-09-04 | Disposition: A | Discharge status: Home / Self Care | Payer: BLUE CROSS/BLUE SHIELD | Source: Ambulatory Visit | Attending: Obstetrics and Gynecology | Admitting: Obstetrics and Gynecology

## 2016-09-04 DIAGNOSIS — N6459 Other signs and symptoms in breast: Secondary | ICD-10-CM

## 2016-09-04 DIAGNOSIS — R928 Other abnormal and inconclusive findings on diagnostic imaging of breast: Secondary | ICD-10-CM | POA: Diagnosis not present

## 2016-09-04 DIAGNOSIS — N6489 Other specified disorders of breast: Secondary | ICD-10-CM | POA: Diagnosis not present

## 2016-09-05 DIAGNOSIS — H4089 Other specified glaucoma: Secondary | ICD-10-CM | POA: Diagnosis not present

## 2016-09-05 DIAGNOSIS — D72819 Decreased white blood cell count, unspecified: Secondary | ICD-10-CM | POA: Diagnosis not present

## 2016-09-05 DIAGNOSIS — Z1389 Encounter for screening for other disorder: Secondary | ICD-10-CM | POA: Diagnosis not present

## 2016-09-05 DIAGNOSIS — M79671 Pain in right foot: Secondary | ICD-10-CM | POA: Diagnosis not present

## 2016-09-05 DIAGNOSIS — Z0000 Encounter for general adult medical examination without abnormal findings: Secondary | ICD-10-CM | POA: Diagnosis not present

## 2016-09-05 DIAGNOSIS — I10 Essential (primary) hypertension: Secondary | ICD-10-CM | POA: Diagnosis not present

## 2016-09-17 DIAGNOSIS — Z1212 Encounter for screening for malignant neoplasm of rectum: Secondary | ICD-10-CM | POA: Diagnosis not present

## 2016-10-08 ENCOUNTER — Encounter: Payer: Self-pay | Admitting: Podiatry

## 2016-10-08 ENCOUNTER — Ambulatory Visit (INDEPENDENT_AMBULATORY_CARE_PROVIDER_SITE_OTHER): Payer: BLUE CROSS/BLUE SHIELD

## 2016-10-08 ENCOUNTER — Ambulatory Visit (INDEPENDENT_AMBULATORY_CARE_PROVIDER_SITE_OTHER): Payer: BLUE CROSS/BLUE SHIELD | Admitting: Podiatry

## 2016-10-08 DIAGNOSIS — M722 Plantar fascial fibromatosis: Secondary | ICD-10-CM

## 2016-10-08 DIAGNOSIS — M79672 Pain in left foot: Secondary | ICD-10-CM | POA: Diagnosis not present

## 2016-10-08 MED ORDER — METHYLPREDNISOLONE 4 MG PO TBPK: ORAL_TABLET | ORAL | 0 refills | Status: DC

## 2016-10-08 MED ORDER — MELOXICAM 15 MG PO TABS: 15.0000 mg | ORAL_TABLET | Freq: Every day | ORAL | 3 refills | Status: DC

## 2016-10-08 NOTE — Progress Notes (Signed)
   Subjective:    Patient ID: Monique Brady, female    DOB: 02/25/1961, 56 y.o.   MRN: [id number]  HPI: Patient presents today on referral from Dr. Virgina Jock. She has had left heel pain for the past several months stable she's been on it a lot lately morning seems to be the worse she's tried stretching and icing and saw Dr. Virgina Jock to no avail. Dr. Virgina Jock noticed that she had a small soft tissue mass in the arch and referred her to [place]. She denies trauma but states she is very active.    Review of Systems  All other systems reviewed and are negative.      Objective:   Physical Exam: Vital signs are stable alert and oriented 3 pulses are palpable. Neurologic sensorium is intact. Deep tendon reflexes are intact. Muscle strength +5 over 5 dorsiflexion plantar flexors and inverters everters onto the musculatures intact. Pain on palpation medial calcaneal tubercle of the left heel area soft tissue lesion measuring centimeter diameter over the medial band of the plantar fascia central arch does demonstrate pain on palpation. Radiographs do not demonstrate any calcification of this area however soft tissue increase in density at the plantar fascia calcaneal insertion site is indicative of plantar fasciitis.          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  Plan: I injected the left plantar fibroma with Kenalog and injected the left heel with Kenalog I also started her on a Medrol Dosepak to be followed by meloxicam placed her in a plantar fascia brace to be followed by a night splint. Discussed appropriate shoe gear stretching exercises ice therapy and shoe modifications. I will follow-up with her in 1 month.

## 2016-10-08 NOTE — Patient Instructions (Signed)

## 2016-11-08 ENCOUNTER — Telehealth: Payer: Self-pay | Admitting: *Deleted

## 2016-11-08 MED ORDER — MELOXICAM 15 MG PO TABS: 15.0000 mg | ORAL_TABLET | Freq: Every day | ORAL | 1 refills | Status: DC

## 2016-11-08 NOTE — Telephone Encounter (Signed)
Received request for 90 day of Meloxicam 15mg . Dr. Milinda Pointer okayed #[id number] +1refill.

## 2016-11-12 ENCOUNTER — Ambulatory Visit: Payer: BLUE CROSS/BLUE SHIELD | Admitting: Podiatry

## 2016-11-14 ENCOUNTER — Encounter: Payer: Self-pay | Admitting: Podiatry

## 2016-11-14 ENCOUNTER — Ambulatory Visit (INDEPENDENT_AMBULATORY_CARE_PROVIDER_SITE_OTHER): Payer: BLUE CROSS/BLUE SHIELD | Admitting: Podiatry

## 2016-11-14 DIAGNOSIS — M810 Age-related osteoporosis without current pathological fracture: Secondary | ICD-10-CM | POA: Diagnosis not present

## 2016-11-14 DIAGNOSIS — M722 Plantar fascial fibromatosis: Secondary | ICD-10-CM

## 2016-11-14 NOTE — Progress Notes (Signed)
She resists today for follow-up of plantar fasciitis to her left foot. She states is doing really good I am unable to take the meloxicam she states because of GI distress.  Objective: Vital signs are stable she is alert and oriented 3. Pulses are palpable. When I asked if she was wearing her night splint she states that she can't sleep with it. She continues to wear her plantar fascia brace however. She has pain on palpation medial calcaneal tubercle of the left heel is very slightly.  Assessment: Well-healing plantar fasciitis left.  Plan: Continue night splint and tissue use with the plantar fascia brace.. Follow up with her as needed.

## 2017-02-12 ENCOUNTER — Telehealth: Payer: Self-pay | Admitting: Podiatry

## 2017-02-12 NOTE — Telephone Encounter (Signed)
Monique Brady with [facility] on [address] called about a refill request for Meloxicam (Mobic) 15 mg tablet that she takes once daily. Please either send a new prescription in or you can call the pharmacy back at [phone].

## 2017-02-13 MED ORDER — MELOXICAM 15 MG PO TABS: 15.0000 mg | ORAL_TABLET | Freq: Every day | ORAL | 2 refills | Status: DC

## 2017-02-13 NOTE — Telephone Encounter (Signed)
Dr. Marta Antu refill Meloxicam 15mg  +2. Orders to [facility].

## 2017-07-01 ENCOUNTER — Other Ambulatory Visit: Payer: Self-pay | Admitting: Internal Medicine

## 2017-07-01 DIAGNOSIS — Z1231 Encounter for screening mammogram for malignant neoplasm of breast: Secondary | ICD-10-CM

## 2017-08-11 ENCOUNTER — Ambulatory Visit
Admission: RE | Admit: 2017-08-11 | Discharge: 2017-08-11 | Disposition: A | Discharge status: Home / Self Care | Payer: BLUE CROSS/BLUE SHIELD | Source: Ambulatory Visit | Attending: Internal Medicine | Admitting: Internal Medicine

## 2017-08-11 DIAGNOSIS — Z1231 Encounter for screening mammogram for malignant neoplasm of breast: Secondary | ICD-10-CM | POA: Diagnosis not present

## 2017-08-25 DIAGNOSIS — H40003 Preglaucoma, unspecified, bilateral: Secondary | ICD-10-CM | POA: Diagnosis not present

## 2017-09-01 ENCOUNTER — Ambulatory Visit: Payer: BLUE CROSS/BLUE SHIELD | Admitting: Obstetrics and Gynecology

## 2017-09-01 DIAGNOSIS — E7849 Other hyperlipidemia: Secondary | ICD-10-CM | POA: Diagnosis not present

## 2017-09-01 DIAGNOSIS — M810 Age-related osteoporosis without current pathological fracture: Secondary | ICD-10-CM | POA: Diagnosis not present

## 2017-09-01 DIAGNOSIS — I10 Essential (primary) hypertension: Secondary | ICD-10-CM | POA: Diagnosis not present

## 2017-09-04 ENCOUNTER — Ambulatory Visit: Payer: BLUE CROSS/BLUE SHIELD | Admitting: Obstetrics and Gynecology

## 2017-09-10 DIAGNOSIS — Z0000 Encounter for general adult medical examination without abnormal findings: Secondary | ICD-10-CM | POA: Diagnosis not present

## 2017-09-10 DIAGNOSIS — I10 Essential (primary) hypertension: Secondary | ICD-10-CM | POA: Diagnosis not present

## 2017-09-10 DIAGNOSIS — Z1389 Encounter for screening for other disorder: Secondary | ICD-10-CM | POA: Diagnosis not present

## 2017-09-10 DIAGNOSIS — H4089 Other specified glaucoma: Secondary | ICD-10-CM | POA: Diagnosis not present

## 2017-09-10 DIAGNOSIS — Z7189 Other specified counseling: Secondary | ICD-10-CM | POA: Diagnosis not present

## 2017-09-10 DIAGNOSIS — M859 Disorder of bone density and structure, unspecified: Secondary | ICD-10-CM | POA: Diagnosis not present

## 2017-09-10 DIAGNOSIS — Z6829 Body mass index (BMI) 29.0-29.9, adult: Secondary | ICD-10-CM | POA: Diagnosis not present

## 2017-09-11 ENCOUNTER — Other Ambulatory Visit: Payer: Self-pay | Admitting: Internal Medicine

## 2017-09-11 DIAGNOSIS — E785 Hyperlipidemia, unspecified: Secondary | ICD-10-CM

## 2017-09-12 DIAGNOSIS — Z1212 Encounter for screening for malignant neoplasm of rectum: Secondary | ICD-10-CM | POA: Diagnosis not present

## 2017-09-13 DIAGNOSIS — D2239 Melanocytic nevi of other parts of face: Secondary | ICD-10-CM | POA: Diagnosis not present

## 2017-09-13 DIAGNOSIS — D485 Neoplasm of uncertain behavior of skin: Secondary | ICD-10-CM | POA: Diagnosis not present

## 2017-09-15 ENCOUNTER — Ambulatory Visit
Admission: RE | Admit: 2017-09-15 | Discharge: 2017-09-15 | Disposition: A | Discharge status: Home / Self Care | Payer: BLUE CROSS/BLUE SHIELD | Source: Ambulatory Visit | Attending: Internal Medicine | Admitting: Internal Medicine

## 2017-09-15 DIAGNOSIS — E785 Hyperlipidemia, unspecified: Secondary | ICD-10-CM | POA: Diagnosis not present

## 2017-10-01 ENCOUNTER — Ambulatory Visit: Payer: BLUE CROSS/BLUE SHIELD | Admitting: Obstetrics and Gynecology

## 2017-10-07 ENCOUNTER — Encounter: Payer: Self-pay | Admitting: Obstetrics and Gynecology

## 2017-10-07 ENCOUNTER — Ambulatory Visit (INDEPENDENT_AMBULATORY_CARE_PROVIDER_SITE_OTHER): Payer: BLUE CROSS/BLUE SHIELD | Admitting: Obstetrics and Gynecology

## 2017-10-07 ENCOUNTER — Other Ambulatory Visit: Payer: Self-pay

## 2017-10-07 VITALS — BP 120/78 | HR 80 | Resp 14 | Ht 63.25 in | Wt 162.0 lb

## 2017-10-07 DIAGNOSIS — Z01419 Encounter for gynecological examination (general) (routine) without abnormal findings: Secondary | ICD-10-CM

## 2017-10-07 DIAGNOSIS — M810 Age-related osteoporosis without current pathological fracture: Secondary | ICD-10-CM

## 2017-10-07 NOTE — Patient Instructions (Signed)

## 2017-10-07 NOTE — Progress Notes (Signed)
57 y.o. G0P0000 MarriedCaucasianF here for annual exam. No vaginal bleeding. No dyspareunia with lubrication.   She has a h/o osteoporosis, she was on Actonel for 6-12 months. She didn't like the side effects, so went off of it.  She had a heart CT this year, agatston score of 176. Primary started her on a statin.     Patient's last menstrual period was 09/30/2002.          Sexually active: Yes.    The current method of family planning is post menopausal status.    Exercising: Yes.    jogging/ tennis/ trainer  Smoker:  Former smoker  Health Maintenance: Pap:  08-17-15 WNL NEG HR HPV  History of abnormal Pap:  no MMG:  08-11-17 WNL  Colonoscopy:  2016- repeat in 5 years  BMD:   10-06-14 osteoporosis  TDaP:  08-19-16 Gardasil: N/A   reports that she has quit smoking. she has never used smokeless tobacco. She reports that she drinks about 6.0 oz of alcohol per week. She reports that she does not use drugs. 2 adopted daughters from [place], traveled a lot with her husbands work, has lived in several different countries.  Daughters are in their 20's. 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 Oldest is in [place], Programme researcher, broadcasting/film/video.  She is moving to [facility] for a few years (husbands work), will keep her house here and travel back and forth.   Past Medical History:  Diagnosis Date  . Endometriosis   . Hypertension   . Infertility, female   . Osteoporosis     Past Surgical History:  Procedure Laterality Date  . LAPAROSCOPY      Current Outpatient Medications  Medication Sig Dispense Refill  . amLODipine (NORVASC) 2.5 MG tablet daily.     . cholecalciferol (VITAMIN D) 1000 units tablet Take 1,000 Units by mouth daily.    Marland Kitchen losartan-hydrochlorothiazide (HYZAAR) 100-12.5 MG per tablet     . rosuvastatin (CRESTOR) 5 MG tablet Take 5 mg by mouth daily.  3  . TRANSDERM-SCOP 1.5 MG 1 patch as needed.     . zolpidem (AMBIEN) 10 MG tablet      No current  facility-administered medications for this visit.     Family History  Problem Relation Age of Onset  . Hypertension Mother   . Hypertension Father   . Stroke Father   . Hypertension Brother   . Hypertension Brother   . Hypertension Brother     Review of Systems  Constitutional: Negative.   HENT: Negative.   Eyes: Negative.   Respiratory: Negative.   Cardiovascular: Negative.   Gastrointestinal: Negative.   Endocrine: Negative.   Genitourinary: Negative.   Musculoskeletal: Negative.   Skin: Negative.   Allergic/Immunologic: Negative.   Neurological: Negative.   Psychiatric/Behavioral: Negative.     Exam:   BP 120/78 (BP Location: Right Arm, Patient Position: Sitting, Cuff Size: Normal)   Pulse 80   Resp 14   Ht 5' 3.25" (1.607 m)   Wt 162 lb (73.5 kg)   LMP 09/30/2002   BMI 28.47 kg/m   Weight change: @WEIGHTCHANGE @ Height:   Height: 5' 3.25" (160.7 cm)  Ht Readings from Last 3 Encounters:  10/07/17 5' 3.25" (1.607 m)  08/29/16 5\' 3"  (1.6 m)  08/17/15 5\' 3"  (1.6 m)    General appearance: alert, cooperative and appears stated age Head: Normocephalic, without obvious abnormality, atraumatic Neck: no adenopathy, supple, symmetrical, trachea midline and thyroid normal to inspection  and palpation Lungs: clear to auscultation bilaterally Cardiovascular: regular rate and rhythm Breasts: normal appearance, no masses or tenderness Abdomen: soft, non-tender; non distended,  no masses,  no organomegaly Extremities: extremities normal, atraumatic, no cyanosis or edema Skin: Skin color, texture, turgor normal. No rashes or lesions Lymph nodes: Cervical, supraclavicular, and axillary nodes normal. No abnormal inguinal nodes palpated Neurologic: Grossly normal   Pelvic: External genitalia:  no lesions              Urethra:  normal appearing urethra with no masses, tenderness or lesions              Bartholins and Skenes: normal                 Vagina: normal appearing  atrophic vagina with normal color and discharge, no lesions              Cervix: no lesions               Bimanual Exam:  Uterus:  normal size, contour, position, consistency, mobility, non-tender              Adnexa: no mass, fullness, tenderness               Rectovaginal: Confirms               Anus:  normal sphincter tone, no lesions  Chaperone was present for exam.  A:  Well Woman with normal exam  P:   Pap next year  DEXA now   Continue calcium and vit D, discussed exercise  Labs with primary  Mammogram UTD  Colonoscopy is UTD  Does breast self exams

## 2017-10-16 ENCOUNTER — Telehealth: Payer: Self-pay | Admitting: Obstetrics and Gynecology

## 2017-10-16 NOTE — Telephone Encounter (Signed)
BMD order placed by Dr. Talbert Nan at last AEX 10/07/17. Spoke with Deatra Canter at [facility], was advised no additional BMD order needed. Deatra Canter will call patient to schedule BMD.   Call returned to patient, advised BMD ordered, [facility] will call directly to schedule. Patient verbalizes understanding and is agreeable.   Routing to provider for final review. Patient is agreeable to disposition. Will close encounter.

## 2017-10-16 NOTE — Telephone Encounter (Signed)
Patient would like an order for a bone density scan faxed to [facility].

## 2017-11-03 ENCOUNTER — Ambulatory Visit
Admission: RE | Admit: 2017-11-03 | Discharge: 2017-11-03 | Disposition: A | Discharge status: Home / Self Care | Payer: BLUE CROSS/BLUE SHIELD | Source: Ambulatory Visit | Attending: Obstetrics and Gynecology | Admitting: Obstetrics and Gynecology

## 2017-11-03 DIAGNOSIS — M810 Age-related osteoporosis without current pathological fracture: Secondary | ICD-10-CM | POA: Diagnosis not present

## 2017-11-03 DIAGNOSIS — Z780 Asymptomatic menopausal state: Secondary | ICD-10-CM | POA: Diagnosis not present

## 2017-11-11 ENCOUNTER — Telehealth: Payer: Self-pay

## 2017-11-11 DIAGNOSIS — M810 Age-related osteoporosis without current pathological fracture: Secondary | ICD-10-CM

## 2017-11-11 NOTE — Telephone Encounter (Signed)
-----   Message from Salvadore Dom, MD sent at 11/05/2017 12:34 PM EST ----- Please let the patient know that her osteoporosis is stable. She has osteoporosis in her left forearm, osteopenia of her spine and a normal density in her left femur.  I think it may be worth while sending her for a consultation with a Rheumatologist or an osteoporosis center. The typical DEXA just looks at the spine and hip. She hasn't tolerated biphosphonates in the past. I think it would be helpful to get their opinion. Please let her know and set her up for a referral.

## 2017-11-11 NOTE — Telephone Encounter (Signed)
I think it would be useful to get the opinion of a Rheumatologist. The studies have been done in women with hip and spine osteoporosis. I'm not sure that she needs medication. The National osteoporosis foundation recommends treatment if she has osteoporosis in her hip or spine or if she has an elevated FRAX risk (which they didn't do because of the osteoporosis in her arm).  I'm not sure why they did arm measurements, that is not standard. If she willing to go for a consultation please set her up to see Dr Bennie Dallas.

## 2017-11-11 NOTE — Telephone Encounter (Signed)
Spoke with patient. Advised of message as seen below from [place]. Patient is agreeable to proceed with referral to Rheumatology. Referral placed to Dr.Deveswar. Aware our referrals coordinator will work to assist with scheduling and she will receive a phone call regarding appointment date and time. Patient is agreeable.  Routing to provider for final review. Patient agreeable to disposition. Will close encounter.

## 2017-11-11 NOTE — Telephone Encounter (Signed)
Spoke with patient. Results given as seen below. Patient verbalizes understanding. Patient is asking if she could start on Prolia instead of seeing a Rheumatologist. States she discussed this with Dr.Jertson at her OV. Advised may need to consult with Rheumatology before proceeding with any medication. Will review with Dr.Jertson.

## 2017-12-12 NOTE — Progress Notes (Signed)
Office Visit Note  Patient: Monique Brady             Date of Birth: 1960/12/17           MRN: [id number]             PCP: Shon Baton, MD Referring: Salvadore Dom, MD Visit Date: 12/24/2017 Occupation: @GUAROCC @    Subjective:  Osteoporosis.   History of Present Illness: Monique Brady is a 57 y.o. female seen in consultation per request of her PCP for evaluation of osteoporosis.  According to patient she had early menopause in her 18s.  She was on Aygestin from mid 30s to her 62s because of endometriosis.  She recalls many years ago she had a bone density which was consistent with osteoporosis at that time she was given Fosamax which was discontinued due to GI side effects and nausea.  She was also given a trial of Actonel which caused similar side effects.  She did not take any treatment for several years.  She was initially taking calcium and vitamin D and then she stopped calcium and continued vitamin D.  She has been exercising and doing some strength training.  Her last DEXA in February 2019 showed T score of -3.4.  She has never had a fracture.  There is positive family history of osteoporosis in her mother.  She denies any history of joint pain although she gets a stiff after prolonged sitting especially in her lower back.  Activities of Daily Living:  Patient reports morning stiffness for 2 minutes.   Patient Denies nocturnal pain.  Difficulty dressing/grooming: Denies Difficulty climbing stairs: Denies Difficulty getting out of chair: Denies Difficulty using hands for taps, buttons, cutlery, and/or writing: Denies   Review of Systems  Constitutional: Negative for fatigue, night sweats, weight gain and weight loss.  HENT: Negative for mouth sores, trouble swallowing, trouble swallowing, mouth dryness and nose dryness.   Eyes: Negative for pain, redness, visual disturbance and dryness.  Respiratory: Negative for cough, shortness of breath and difficulty breathing.     Cardiovascular: Positive for hypertension. Negative for chest pain, palpitations, irregular heartbeat and swelling in legs/feet.  Gastrointestinal: Negative for blood in stool, constipation and diarrhea.  Endocrine: Negative for increased urination.  Genitourinary: Negative for vaginal dryness.  Musculoskeletal: Positive for morning stiffness. Negative for arthralgias, joint pain, joint swelling, myalgias, muscle weakness, muscle tenderness and myalgias.  Skin: Negative for color change, rash, hair loss, skin tightness, ulcers and sensitivity to sunlight.  Allergic/Immunologic: Negative for susceptible to infections.  Neurological: Negative for dizziness, memory loss, night sweats and weakness.  Hematological: Negative for swollen glands.  Psychiatric/Behavioral: Negative for depressed mood and sleep disturbance. The patient is not nervous/anxious.     PMFS History:  Patient Active Problem List   Diagnosis Date Noted  . Essential hypertension, benign 07/14/2013  . Osteoporosis, unspecified 07/14/2013    Past Medical History:  Diagnosis Date  . Endometriosis   . Hypertension   . Infertility, female   . Osteoporosis     Family History  Problem Relation Age of Onset  . Hypertension Mother   . Hypertension Father   . Stroke Father   . Hypertension Brother   . Hypertension Brother   . Hypertension Brother    Past Surgical History:  Procedure Laterality Date  . LAPAROSCOPY    . TONSILLECTOMY     as a child    Social History   Social History Narrative  . Not on file  Objective: Vital Signs: BP 137/80 (BP Location: Left Arm, Patient Position: Sitting, Cuff Size: Normal)   Pulse 86   Resp 16   Ht 5\' 3"  (1.6 m)   Wt 163 lb 8 oz (74.2 kg)   LMP 09/30/2002   BMI 28.96 kg/m    Physical Exam  Constitutional: She is oriented to person, place, and time. She appears well-developed and well-nourished.  HENT:  Head: Normocephalic and atraumatic.  Eyes: Conjunctivae and  EOM are normal.  Neck: Normal range of motion.  Cardiovascular: Normal rate, regular rhythm, normal heart sounds and intact distal pulses.  Pulmonary/Chest: Effort normal and breath sounds normal.  Abdominal: Soft. Bowel sounds are normal.  Lymphadenopathy:    She has no cervical adenopathy.  Neurological: She is alert and oriented to person, place, and time.  Skin: Skin is warm and dry. Capillary refill takes less than 2 seconds.  Psychiatric: She has a normal mood and affect. Her behavior is normal.  Nursing note and vitals reviewed.    Musculoskeletal Exam: C-spine thoracic lumbar spine good range of motion.  Shoulder joints elbow joints wrist joint MCPs PIPs DIPs were in good range of motion.  She has some PIP/DIP thickening consistent with osteoarthritis.  Hip joints knee joints ankles MTPs PIPs with good range of motion.  CDAI Exam: No CDAI exam completed.    Investigation: No additional findings. CBC Latest Ref Rng & Units 07/18/2014 07/14/2013  WBC 4.0 - 10.5 K/uL 3.9(L) -  Hemoglobin 12.0 - 15.0 g/dL 13.7 12.6  Hematocrit 36.0 - 46.0 % 39.8 -  Platelets 150 - 400 K/uL 228 -   CMP Latest Ref Rng & Units 07/18/2014  Glucose 70 - 99 mg/dL 94  BUN 6 - 23 mg/dL 12  Creatinine 0.50 - 1.10 mg/dL 0.71  Sodium 135 - 145 mEq/L 141  Potassium 3.5 - 5.3 mEq/L 4.3  Chloride 96 - 112 mEq/L 104  CO2 19 - 32 mEq/L 27  Calcium 8.4 - 10.5 mg/dL 9.4  Total Protein 6.0 - 8.3 g/dL 6.8  Total Bilirubin 0.2 - 1.2 mg/dL 0.5  Alkaline Phos 39 - 117 U/L 53  AST 0 - 37 U/L 21  ALT 0 - 35 U/L 19    Imaging: No results found.  Speciality Comments: No specialty comments available.    Procedures:  No procedures performed Allergies: Patient has no known allergies.   Assessment / Plan:     Visit Diagnoses: Osteoporosis without current pathological fracture, unspecified osteoporosis type -we had detailed discussion regarding osteoporosis.  She has tried Fosamax and Actonel several years  ago and discontinued due to GI side effects.  I compared her bone density from 2016 and 2019.  Her bone mass density has improved over the 3 years.  There was no percent change given in the report.  Although she is a still in the osteoporosis range.  She has been very active and doing with strength training.  She has not been taking calcium and vitamin D on regular basis.  We had detailed discussion regarding that.  I also discussed IV Reclast use with her.  Indication side effects contraindications were discussed at length.  She is uncertain if she wants to go with Reclast as her bone mass has improved and not deteriorated over the last 3 years.  She will reviewed the Reclast and will let me know if she wants to proceed with infusion.  In the meantime she will start taking calcium and vitamin D.  The dosing was  discussed at length.  Vitamin D 1,000 units dailyPreviously on Actonel-did not tolerate side effects DEXA 11/03/17: T-score -3.0 forearm - Plan: Parathyroid hormone, intact (no Ca), VITAMIN D 25 Hydroxy (Vit-D Deficiency, Fractures), TSH, Serum protein electrophoresis with reflex, CBC with Differential/Platelet, COMPLETE METABOLIC PANEL WITH GFR.  Patient later decided that she will just continue with calcium and vitamin D for right now and would like to repeat a bone density next year.  Primary osteoarthritis of both hands: She does have some osteoarthritic changes in her hands but not interfering with her daily activities currently.  Essential hypertension, benign  Dyslipidemia    Orders: Orders Placed This Encounter  Procedures  . Parathyroid hormone, intact (no Ca)  . VITAMIN D 25 Hydroxy (Vit-D Deficiency, Fractures)  . TSH  . Serum protein electrophoresis with reflex  . CBC with Differential/Platelet  . COMPLETE METABOLIC PANEL WITH GFR   No orders of the defined types were placed in this encounter.   Face-to-face time spent with patient was 45 minutes.  Greater than 50% of time was  spent in counseling and coordination of care.  Follow-Up Instructions: No follow-ups on file.   Bo Merino, MD  Note - This record has been created using Editor, commissioning.  Chart creation errors have been sought, but may not always  have been located. Such creation errors do not reflect on  the standard of medical care.

## 2017-12-24 ENCOUNTER — Encounter: Payer: Self-pay | Admitting: Rheumatology

## 2017-12-24 ENCOUNTER — Ambulatory Visit (INDEPENDENT_AMBULATORY_CARE_PROVIDER_SITE_OTHER): Payer: BLUE CROSS/BLUE SHIELD | Admitting: Rheumatology

## 2017-12-24 VITALS — BP 137/80 | HR 86 | Resp 16 | Ht 63.0 in | Wt 163.5 lb

## 2017-12-24 DIAGNOSIS — M810 Age-related osteoporosis without current pathological fracture: Secondary | ICD-10-CM

## 2017-12-24 DIAGNOSIS — M19041 Primary osteoarthritis, right hand: Secondary | ICD-10-CM

## 2017-12-24 DIAGNOSIS — E785 Hyperlipidemia, unspecified: Secondary | ICD-10-CM | POA: Diagnosis not present

## 2017-12-24 DIAGNOSIS — M19042 Primary osteoarthritis, left hand: Secondary | ICD-10-CM | POA: Diagnosis not present

## 2017-12-24 DIAGNOSIS — I10 Essential (primary) hypertension: Secondary | ICD-10-CM | POA: Diagnosis not present

## 2017-12-24 NOTE — Patient Instructions (Signed)

## 2017-12-26 LAB — COMPLETE METABOLIC PANEL WITH GFR
AG Ratio: 2.4 (calc) (ref 1.0–2.5)
ALKALINE PHOSPHATASE (APISO): 55 U/L (ref 33–130)
ALT: 23 U/L (ref 6–29)
AST: 23 U/L (ref 10–35)
Albumin: 4.8 g/dL (ref 3.6–5.1)
BILIRUBIN TOTAL: 0.6 mg/dL (ref 0.2–1.2)
BUN: 15 mg/dL (ref 7–25)
CHLORIDE: 104 mmol/L (ref 98–110)
CO2: 28 mmol/L (ref 20–32)
Calcium: 9.9 mg/dL (ref 8.6–10.4)
Creat: 0.65 mg/dL (ref 0.50–1.05)
GFR, Est African American: 113 mL/min/{1.73_m2} (ref >=60)
GFR, Est Non African American: 98 mL/min/{1.73_m2} (ref >=60)
GLUCOSE: 98 mg/dL (ref 65–99)
Globulin: 2 g/dL (calc) (ref 1.9–3.7)
Potassium: 4.1 mmol/L (ref 3.5–5.3)
Sodium: 140 mmol/L (ref 135–146)
Total Protein: 6.8 g/dL (ref 6.1–8.1)

## 2017-12-26 LAB — PROTEIN ELECTROPHORESIS, SERUM, WITH REFLEX
Albumin ELP: 4.9 g/dL — ABNORMAL HIGH (ref 3.8–4.8)
Alpha 1: 0.3 g/dL (ref 0.2–0.3)
Alpha 2: 0.6 g/dL (ref 0.5–0.9)
BETA GLOBULIN: 0.4 g/dL (ref 0.4–0.6)
Beta 2: 0.2 g/dL (ref 0.2–0.5)
GAMMA GLOBULIN: 0.6 g/dL — AB (ref 0.8–1.7)
Total Protein: 7.1 g/dL (ref 6.1–8.1)

## 2017-12-26 LAB — CBC WITH DIFFERENTIAL/PLATELET
BASOS ABS: 21 {cells}/uL (ref 0–200)
Basophils Relative: 0.5 %
EOS PCT: 3.7 %
Eosinophils Absolute: 152 cells/uL (ref 15–500)
HCT: 38.7 % (ref 35.0–45.0)
HEMOGLOBIN: 13.3 g/dL (ref 11.7–15.5)
Lymphs Abs: 1480 cells/uL (ref 850–3900)
MCH: 31.6 pg (ref 27.0–33.0)
MCHC: 34.4 g/dL (ref 32.0–36.0)
MCV: 91.9 fL (ref 80.0–100.0)
MPV: 11.1 fL (ref 7.5–12.5)
Monocytes Relative: 8.8 %
NEUTROS ABS: 2087 {cells}/uL (ref 1500–7800)
Neutrophils Relative %: 50.9 %
PLATELETS: 243 10*3/uL (ref 140–400)
RBC: 4.21 10*6/uL (ref 3.80–5.10)
RDW: 11.3 % (ref 11.0–15.0)
TOTAL LYMPHOCYTE: 36.1 %
WBC mixed population: 361 cells/uL (ref 200–950)
WBC: 4.1 10*3/uL (ref 3.8–10.8)

## 2017-12-26 LAB — PARATHYROID HORMONE, INTACT (NO CA): PTH: 32 pg/mL (ref 14–64)

## 2017-12-26 LAB — VITAMIN D 25 HYDROXY (VIT D DEFICIENCY, FRACTURES): Vit D, 25-Hydroxy: 49 ng/mL (ref 30–100)

## 2017-12-26 LAB — TSH: TSH: 0.98 mIU/L (ref 0.40–4.50)

## 2017-12-26 NOTE — Progress Notes (Signed)
WNLs

## 2018-01-27 ENCOUNTER — Ambulatory Visit: Payer: BLUE CROSS/BLUE SHIELD | Admitting: Rheumatology

## 2018-07-21 ENCOUNTER — Other Ambulatory Visit: Payer: Self-pay | Admitting: Internal Medicine

## 2018-07-21 DIAGNOSIS — Z1231 Encounter for screening mammogram for malignant neoplasm of breast: Secondary | ICD-10-CM

## 2018-08-26 ENCOUNTER — Ambulatory Visit
Admission: RE | Admit: 2018-08-26 | Discharge: 2018-08-26 | Disposition: A | Discharge status: Home / Self Care | Payer: 59 | Source: Ambulatory Visit | Attending: Internal Medicine | Admitting: Internal Medicine

## 2018-08-26 DIAGNOSIS — Z1231 Encounter for screening mammogram for malignant neoplasm of breast: Secondary | ICD-10-CM | POA: Diagnosis not present

## 2018-10-04 DIAGNOSIS — M545 Low back pain: Secondary | ICD-10-CM | POA: Diagnosis not present

## 2018-10-04 DIAGNOSIS — R110 Nausea: Secondary | ICD-10-CM | POA: Diagnosis not present

## 2018-10-08 DIAGNOSIS — H40003 Preglaucoma, unspecified, bilateral: Secondary | ICD-10-CM | POA: Diagnosis not present

## 2018-10-21 ENCOUNTER — Ambulatory Visit: Payer: BLUE CROSS/BLUE SHIELD | Admitting: Obstetrics and Gynecology

## 2018-10-29 ENCOUNTER — Telehealth: Payer: Self-pay | Admitting: Obstetrics and Gynecology

## 2018-11-02 ENCOUNTER — Encounter: Payer: Self-pay | Admitting: Obstetrics and Gynecology

## 2018-11-02 ENCOUNTER — Other Ambulatory Visit: Payer: Self-pay

## 2018-11-02 ENCOUNTER — Ambulatory Visit (INDEPENDENT_AMBULATORY_CARE_PROVIDER_SITE_OTHER): Payer: 59 | Admitting: Obstetrics and Gynecology

## 2018-11-02 ENCOUNTER — Other Ambulatory Visit (HOSPITAL_COMMUNITY)
Admission: RE | Admit: 2018-11-02 | Discharge: 2018-11-02 | Disposition: A | Discharge status: Home / Self Care | Payer: 59 | Source: Ambulatory Visit | Attending: Obstetrics and Gynecology | Admitting: Obstetrics and Gynecology

## 2018-11-02 VITALS — BP 132/82 | HR 64 | Ht 63.0 in | Wt 163.8 lb

## 2018-11-02 DIAGNOSIS — Z0000 Encounter for general adult medical examination without abnormal findings: Secondary | ICD-10-CM

## 2018-11-02 DIAGNOSIS — Z01419 Encounter for gynecological examination (general) (routine) without abnormal findings: Secondary | ICD-10-CM | POA: Diagnosis not present

## 2018-11-02 DIAGNOSIS — Z124 Encounter for screening for malignant neoplasm of cervix: Secondary | ICD-10-CM | POA: Insufficient documentation

## 2018-11-02 DIAGNOSIS — M818 Other osteoporosis without current pathological fracture: Secondary | ICD-10-CM | POA: Diagnosis not present

## 2018-11-02 NOTE — Patient Instructions (Signed)

## 2018-11-02 NOTE — Progress Notes (Signed)
58 y.o. G0P0000 Married White or Caucasian Not Hispanic or Latino female here for annual exam.   Her friends pointed out a lump on her upper chest.  No bleeding. No bowel or bladder issues. Sexually active, no pain.  She has seen a Rheumatologist for her osteoporosis, previously treated with Actonel, didn't tolerate it. Discussed options for treatment, declined for now.     Patient's last menstrual period was 09/30/2002.          Sexually active: Yes.    The current method of family planning is post menopausal status.    Exercising: No.  The patient does not participate in regular exercise at present. Smoker:  no  Health Maintenance: Pap:  08-17-15 WNL NEG HR HPV  History of abnormal Pap:  no MMG:  08/26/2018 Birads 1 negative Colonoscopy:  2016- repeat in 5 years  BMD:   11/03/2017 Osteoporosis TDaP:  08-19-16 Gardasil: N/A   reports that she has quit smoking. She has never used smokeless tobacco. She reports current alcohol use of about 7.0 standard drinks of alcohol per week. She reports that she does not use drugs.  2 adopted daughters from [place], traveled a lot with her husbands work, has lived in several different countries.  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 Oldest is in [place], Programme researcher, broadcasting/film/video.  Husband working in [place], will keep her house here and travel back and forth.   Past Medical History:  Diagnosis Date  . Endometriosis   . Hypertension   . Infertility, female   . Osteoporosis     Past Surgical History:  Procedure Laterality Date  . LAPAROSCOPY    . TONSILLECTOMY     as a child     Current Outpatient Medications  Medication Sig Dispense Refill  . amLODipine (NORVASC) 2.5 MG tablet daily.     . cholecalciferol (VITAMIN D) 1000 units tablet Take 1,000 Units by mouth daily.    Marland Kitchen losartan-hydrochlorothiazide (HYZAAR) 100-12.5 MG per tablet Take 1 tablet by mouth daily.     . Multiple Vitamins-Minerals (MULTIVITAMIN ADULT PO)  Take by mouth daily.    . rosuvastatin (CRESTOR) 5 MG tablet Take 5 mg by mouth daily.  3  . TRANSDERM-SCOP 1.5 MG 1 patch as needed.     . zolpidem (AMBIEN) 10 MG tablet Take 10 mg by mouth as needed.      No current facility-administered medications for this visit.     Family History  Problem Relation Age of Onset  . Hypertension Mother   . Hypertension Father   . Stroke Father   . Hypertension Brother   . Hypertension Brother   . Hypertension Brother     Review of Systems  Constitutional: Negative.   HENT:       Swollen area on neck  Eyes: Negative.   Respiratory: Negative.   Cardiovascular: Negative.   Gastrointestinal: Negative.   Endocrine: Negative.   Genitourinary: Negative.   Musculoskeletal: Negative.   Skin: Negative.   Allergic/Immunologic: Negative.   Neurological: Negative.   Hematological: Negative.   Psychiatric/Behavioral: Negative.     Exam:   BP 132/82 (BP Location: Right Arm, Patient Position: Sitting, Cuff Size: Normal)   Pulse 64   Ht 5\' 3"  (1.6 m)   Wt 163 lb 12.8 oz (74.3 kg)   LMP 09/30/2002   BMI 29.02 kg/m   Weight change: @WEIGHTCHANGE @ Height:   Height: 5\' 3"  (160 cm)  Ht Readings from Last 3 Encounters:  11/02/18  5\' 3"  (1.6 m)  12/24/17 5\' 3"  (1.6 m)  10/07/17 5' 3.25" (1.607 m)    General appearance: alert, cooperative and appears stated age Head: Normocephalic, without obvious abnormality, atraumatic Neck: no adenopathy, supple, symmetrical, trachea midline and thyroid normal to inspection and palpation Lungs: clear to auscultation bilaterally Cardiovascular: regular rate and rhythm Breasts: normal appearance, no masses or tenderness Abdomen: soft, non-tender; non distended,  no masses,  no organomegaly Extremities: extremities normal, atraumatic, no cyanosis or edema Skin: Skin color, texture, turgor normal. No rashes or lesions Lymph nodes: Cervical, supraclavicular, and axillary nodes normal. No abnormal inguinal nodes  palpated Neurologic: Grossly normal   Pelvic: External genitalia:  no lesions              Urethra:  normal appearing urethra with no masses, tenderness or lesions              Bartholins and Skenes: normal                 Vagina: atrophic appearing vagina with normal color and discharge, no lesions              Cervix: no lesions               Bimanual Exam:  Uterus:  normal size, contour, position, consistency, mobility, non-tender              Adnexa: no mass, fullness, tenderness               Rectovaginal: Confirms               Anus:  normal sphincter tone, no lesions  Chaperone was present for exam.  A:  Well Woman with normal exam  Osteoporosis, declines treatment, has seen Rheumatology   P:   Pap with hpv  Mammogram UTD  Colonoscopy next year  Discussed breast self exam  Discussed calcium and vit D intake  DEXA in 2/21

## 2018-11-03 LAB — CBC
HEMATOCRIT: 38.8 % (ref 34.0–46.6)
Hemoglobin: 13.1 g/dL (ref 11.1–15.9)
MCH: 32.1 pg (ref 26.6–33.0)
MCHC: 33.8 g/dL (ref 31.5–35.7)
MCV: 95 fL (ref 79–97)
Platelets: 201 10*3/uL (ref 150–450)
RBC: 4.08 x10E6/uL (ref 3.77–5.28)
RDW: 11.8 % (ref 11.7–15.4)
WBC: 4.1 10*3/uL (ref 3.4–10.8)

## 2018-11-03 LAB — COMPREHENSIVE METABOLIC PANEL
ALBUMIN: 4.9 g/dL (ref 3.8–4.9)
ALT: 34 IU/L — ABNORMAL HIGH (ref 0–32)
AST: 27 IU/L (ref 0–40)
Albumin/Globulin Ratio: 2.5 — ABNORMAL HIGH (ref 1.2–2.2)
Alkaline Phosphatase: 55 IU/L (ref 39–117)
BUN / CREAT RATIO: 16 (ref 9–23)
BUN: 10 mg/dL (ref 6–24)
Bilirubin Total: 0.3 mg/dL (ref 0.0–1.2)
CO2: 26 mmol/L (ref 20–29)
CREATININE: 0.61 mg/dL (ref 0.57–1.00)
Calcium: 9.8 mg/dL (ref 8.7–10.2)
Chloride: 100 mmol/L (ref 96–106)
GFR calc non Af Amer: 100 mL/min/{1.73_m2} (ref >59)
GFR, EST AFRICAN AMERICAN: 116 mL/min/{1.73_m2} (ref >59)
Globulin, Total: 2 g/dL (ref 1.5–4.5)
Glucose: 84 mg/dL (ref 65–99)
Potassium: 3.9 mmol/L (ref 3.5–5.2)
Sodium: 140 mmol/L (ref 134–144)
TOTAL PROTEIN: 6.9 g/dL (ref 6.0–8.5)

## 2018-11-03 LAB — LIPID PANEL
Chol/HDL Ratio: 2.4 ratio (ref 0.0–4.4)
Cholesterol, Total: 239 mg/dL — ABNORMAL HIGH (ref 100–199)
HDL: 99 mg/dL (ref >39)
LDL CALC: 123 mg/dL — AB (ref 0–99)
Triglycerides: 87 mg/dL (ref 0–149)
VLDL CHOLESTEROL CAL: 17 mg/dL (ref 5–40)

## 2018-11-03 LAB — CYTOLOGY - PAP
Diagnosis: NEGATIVE
HPV: NOT DETECTED

## 2018-11-03 LAB — TSH: TSH: 1.1 u[IU]/mL (ref 0.450–4.500)

## 2018-11-05 ENCOUNTER — Telehealth: Payer: Self-pay

## 2018-11-05 DIAGNOSIS — I10 Essential (primary) hypertension: Secondary | ICD-10-CM | POA: Diagnosis not present

## 2018-11-05 DIAGNOSIS — R82998 Other abnormal findings in urine: Secondary | ICD-10-CM | POA: Diagnosis not present

## 2018-11-05 DIAGNOSIS — M810 Age-related osteoporosis without current pathological fracture: Secondary | ICD-10-CM | POA: Diagnosis not present

## 2018-11-05 NOTE — Telephone Encounter (Signed)
Left message to call Kaitlyn at [phone]. 

## 2018-11-05 NOTE — Telephone Encounter (Signed)
-----   Message from Salvadore Dom, MD sent at 11/04/2018  1:47 PM EST ----- Please let the patient know that one of her LFT's is mildly elevated, she should cut back on ETOH intake and have her liver panel rechecked in a month. The rest of her lab work was fine. 02 recall

## 2018-11-05 NOTE — Telephone Encounter (Signed)
Spoke with patient. Results given. Patient verbalizes understanding. 1 month lab appointment scheduled for 12/02/2018 at 9:15 am. Patient is agreeable to date and time. 02 recall entered. Encounter closed.

## 2018-11-06 DIAGNOSIS — E7849 Other hyperlipidemia: Secondary | ICD-10-CM | POA: Diagnosis not present

## 2018-11-06 DIAGNOSIS — Z1212 Encounter for screening for malignant neoplasm of rectum: Secondary | ICD-10-CM | POA: Diagnosis not present

## 2018-11-06 DIAGNOSIS — M545 Low back pain, unspecified: Secondary | ICD-10-CM | POA: Insufficient documentation

## 2018-11-06 DIAGNOSIS — Z0000 Encounter for general adult medical examination without abnormal findings: Secondary | ICD-10-CM | POA: Diagnosis not present

## 2018-11-06 DIAGNOSIS — I10 Essential (primary) hypertension: Secondary | ICD-10-CM | POA: Diagnosis not present

## 2018-11-18 ENCOUNTER — Ambulatory Visit: Payer: BLUE CROSS/BLUE SHIELD | Admitting: Obstetrics and Gynecology

## 2018-11-27 ENCOUNTER — Telehealth: Payer: Self-pay | Admitting: Obstetrics and Gynecology

## 2018-11-27 NOTE — Telephone Encounter (Signed)
Patient canceled her [facility] lab appointment 12/02/18 and will call later to reschedule.

## 2018-12-02 ENCOUNTER — Other Ambulatory Visit: Payer: 59

## 2018-12-25 ENCOUNTER — Ambulatory Visit: Payer: BLUE CROSS/BLUE SHIELD | Admitting: Rheumatology

## 2019-07-19 ENCOUNTER — Other Ambulatory Visit: Payer: Self-pay | Admitting: Internal Medicine

## 2019-07-19 DIAGNOSIS — Z1231 Encounter for screening mammogram for malignant neoplasm of breast: Secondary | ICD-10-CM

## 2019-09-06 ENCOUNTER — Ambulatory Visit
Admission: RE | Admit: 2019-09-06 | Discharge: 2019-09-06 | Disposition: A | Discharge status: Home / Self Care | Payer: 59 | Source: Ambulatory Visit | Attending: Internal Medicine | Admitting: Internal Medicine

## 2019-09-06 ENCOUNTER — Other Ambulatory Visit: Payer: Self-pay

## 2019-09-06 DIAGNOSIS — Z1231 Encounter for screening mammogram for malignant neoplasm of breast: Secondary | ICD-10-CM

## 2019-11-04 ENCOUNTER — Ambulatory Visit: Payer: 59 | Admitting: Obstetrics and Gynecology

## 2019-12-10 ENCOUNTER — Other Ambulatory Visit: Payer: Self-pay

## 2019-12-13 ENCOUNTER — Other Ambulatory Visit: Payer: Self-pay

## 2019-12-13 ENCOUNTER — Encounter: Payer: Self-pay | Admitting: Obstetrics and Gynecology

## 2019-12-13 ENCOUNTER — Ambulatory Visit (INDEPENDENT_AMBULATORY_CARE_PROVIDER_SITE_OTHER): Payer: 59 | Admitting: Obstetrics and Gynecology

## 2019-12-13 VITALS — BP 134/64 | HR 76 | Temp 98.1°F | Ht 62.25 in | Wt 151.0 lb

## 2019-12-13 DIAGNOSIS — M818 Other osteoporosis without current pathological fracture: Secondary | ICD-10-CM

## 2019-12-13 DIAGNOSIS — Z01419 Encounter for gynecological examination (general) (routine) without abnormal findings: Secondary | ICD-10-CM | POA: Diagnosis not present

## 2019-12-13 NOTE — Patient Instructions (Signed)

## 2019-12-13 NOTE — Progress Notes (Signed)
59 y.o. [place] Married White or Caucasian Not Hispanic or Latino female here for annual exam. No vaginal bleeding. No dyspareunia, uses lubricant.     She got her 1st Covid vaccine.   Patient's last menstrual period was 09/30/2002.          Sexually active: Yes.    The current method of family planning is post menopausal status.    Exercising: Yes.    walking tennis home exercise  Smoker:  no  Health Maintenance: Pap: 11/02/18 normal HR HPV Neg, 08-17-15 WNL NEG HR HPV History of abnormal Pap:  no MMG:  09/06/19 Density B Bi-rads 1 neg  BMD:   11/03/17 Osteoporosis, will do with her primary Colonoscopy: 2016 Repeat in 5 years  TDaP:  08/29/16  Gardasil: NA   reports that she has quit smoking. She has never used smokeless tobacco. She reports current alcohol use of about 7.0 standard drinks of alcohol per week. She reports that she does not use drugs. 2 adult daughters (adopted from [place]).  Has a Restaurant manager, fast food   Past Medical History:  Diagnosis Date  . Endometriosis   . Hypertension   . Infertility, female   . Osteoporosis     Past Surgical History:  Procedure Laterality Date  . LAPAROSCOPY    . TONSILLECTOMY     as a child     Current Outpatient Medications  Medication Sig Dispense Refill  . amLODipine (NORVASC) 2.5 MG tablet daily.     . cholecalciferol (VITAMIN D) 1000 units tablet Take 1,000 Units by mouth daily.    Marland Kitchen losartan-hydrochlorothiazide (HYZAAR) 100-12.5 MG per tablet Take 1 tablet by mouth daily.     . Multiple Vitamins-Minerals (MULTIVITAMIN ADULT PO) Take by mouth daily.    . rosuvastatin (CRESTOR) 5 MG tablet Take 5 mg by mouth daily.  3  . TRANSDERM-SCOP 1.5 MG 1 patch as needed.     . zolpidem (AMBIEN) 10 MG tablet Take 10 mg by mouth as needed.      No current facility-administered medications for this visit.    Family History  Problem Relation Age of Onset  . Hypertension Mother   . Hypertension Father   . Stroke Father   . Hypertension  Brother   . Hypertension Brother   . Hypertension Brother     Review of Systems  Constitutional: Negative.   HENT: Negative.   Eyes: Negative.   Respiratory: Negative.   Cardiovascular: Negative.   Gastrointestinal: Negative.   Endocrine: Negative.   Genitourinary: Negative.   Musculoskeletal: Negative.   Skin: Negative.   Allergic/Immunologic: Negative.   Hematological: Negative.   Psychiatric/Behavioral: Negative.     Exam:   BP 134/64   Pulse 76   Temp 98.1 F (36.7 C)   Ht 5' 2.25" (1.581 m)   Wt 151 lb (68.5 kg)   LMP 09/30/2002   SpO2 98%   BMI 27.40 kg/m   Weight change: @WEIGHTCHANGE @ Height:   Height: 5' 2.25" (158.1 cm)  Ht Readings from Last 3 Encounters:  12/13/19 5' 2.25" (1.581 m)  11/02/18 5\' 3"  (1.6 m)  12/24/17 5\' 3"  (1.6 m)    General appearance: alert, cooperative and appears stated age Head: Normocephalic, without obvious abnormality, atraumatic Neck: no adenopathy, supple, symmetrical, trachea midline and thyroid normal to inspection and palpation Lungs: clear to auscultation bilaterally Cardiovascular: regular rate and rhythm Breasts: normal appearance, no masses or tenderness Abdomen: soft, non-tender; non distended,  no masses,  no organomegaly Extremities: extremities normal, atraumatic,  no cyanosis or edema Skin: Skin color, texture, turgor normal. No rashes or lesions Lymph nodes: Cervical, supraclavicular, and axillary nodes normal. No abnormal inguinal nodes palpated Neurologic: Grossly normal   Pelvic: External genitalia:  no lesions              Urethra:  normal appearing urethra with no masses, tenderness or lesions              Bartholins and Skenes: normal                 Vagina: atrophic appearing vagina with normal color and discharge, no lesions              Cervix: no lesions               Bimanual Exam:  Uterus:  normal size, contour, position, consistency, mobility, non-tender              Adnexa: no mass, fullness,  tenderness               Rectovaginal: Confirms               Anus:  normal sphincter tone, no lesions  Gae Dry chaperoned for the exam.  A:  Well Woman with normal exam  Osteoporosis  P:   No pap this year  DEXA due, will schedule with her primary  Colonoscopy due this year  Labs with primary  Discussed breast self exam  Discussed calcium and vit D intake

## 2020-08-01 ENCOUNTER — Other Ambulatory Visit: Payer: Self-pay | Admitting: Internal Medicine

## 2020-08-01 DIAGNOSIS — Z1231 Encounter for screening mammogram for malignant neoplasm of breast: Secondary | ICD-10-CM

## 2020-09-08 ENCOUNTER — Ambulatory Visit: Payer: 59

## 2020-09-25 ENCOUNTER — Other Ambulatory Visit: Payer: Self-pay

## 2020-09-25 ENCOUNTER — Ambulatory Visit
Admission: RE | Admit: 2020-09-25 | Discharge: 2020-09-25 | Disposition: A | Discharge status: Home / Self Care | Payer: 59 | Source: Ambulatory Visit | Attending: Internal Medicine | Admitting: Internal Medicine

## 2020-09-25 DIAGNOSIS — Z1231 Encounter for screening mammogram for malignant neoplasm of breast: Secondary | ICD-10-CM

## 2020-12-14 ENCOUNTER — Ambulatory Visit: Payer: 59 | Admitting: Obstetrics and Gynecology

## 2021-02-19 NOTE — Progress Notes (Signed)
60 y.o. G0P0000 Married White or Caucasian Not Hispanic or Latino female here for annual exam.  No vaginal bleeding. Sexually active, uses a lubricant, no pain.   No bowel or bladder c/o.     Patient's last menstrual period was 09/30/2002.          Sexually active: Yes.    The current method of family planning is post menopausal status.    Exercising: Yes.    personal trainer. walking , tennis  Smoker:  no  Health Maintenance: Pap:   11/02/18 normal HR HPV Neg, 08-17-15 WNL NEG HR HPV History of abnormal Pap:  no MMG:  09/26/20 density B Bi-rads 1 neg  BMD:   11/03/17 Osteoporosis. Recently had a DEXA with her primary Colonoscopy: 2016 repeat in 5 years, she will call to schedule.   TDaP:  08/29/16  Gardasil: NA   reports that she has quit smoking. She has never used smokeless tobacco. She reports current alcohol use of about 7.0 standard drinks of alcohol per week. She reports that she does not use drugs. She is having ~10 drinks a week. 2 adult daughters (adopted from [place]). One daughter is getting married in 10/22.  Has a Restaurant manager, fast food   Past Medical History:  Diagnosis Date  . Endometriosis   . Hypertension   . Infertility, female   . Osteoporosis     Past Surgical History:  Procedure Laterality Date  . LAPAROSCOPY    . TONSILLECTOMY     as a child     Current Outpatient Medications  Medication Sig Dispense Refill  . amLODipine (NORVASC) 2.5 MG tablet daily.     . cholecalciferol (VITAMIN D) 1000 units tablet Take 1,000 Units by mouth daily.    Marland Kitchen losartan-hydrochlorothiazide (HYZAAR) 100-12.5 MG per tablet Take 1 tablet by mouth daily.     . Multiple Vitamins-Minerals (MULTIVITAMIN ADULT PO) Take by mouth daily.    . rosuvastatin (CRESTOR) 5 MG tablet Take 5 mg by mouth daily.  3  . TRANSDERM-SCOP 1.5 MG 1 patch as needed.     . zolpidem (AMBIEN) 10 MG tablet Take 10 mg by mouth as needed.      No current facility-administered medications for this visit.     Family History  Problem Relation Age of Onset  . Hypertension Mother   . Hypertension Father   . Stroke Father   . Hypertension Brother   . Hypertension Brother   . Hypertension Brother     Review of Systems  All other systems reviewed and are negative.   Exam:   BP 122/63   Pulse 73   Ht 5\' 3"  (1.6 m)   Wt 153 lb (69.4 kg)   LMP 09/30/2002   SpO2 100%   BMI 27.10 kg/m   Weight change: @WEIGHTCHANGE @ Height:   Height: 5\' 3"  (160 cm)  Ht Readings from Last 3 Encounters:  02/20/21 5\' 3"  (1.6 m)  12/13/19 5' 2.25" (1.581 m)  11/02/18 5\' 3"  (1.6 m)    General appearance: alert, cooperative and appears stated age Head: Normocephalic, without obvious abnormality, atraumatic Neck: no adenopathy, supple, symmetrical, trachea midline and thyroid normal to inspection and palpation Lungs: clear to auscultation bilaterally Cardiovascular: regular rate and rhythm Breasts: normal appearance, no masses or tenderness Abdomen: soft, non-tender; non distended,  no masses,  no organomegaly Extremities: extremities normal, atraumatic, no cyanosis or edema Skin: Skin color, texture, turgor normal. No rashes or lesions Lymph nodes: Cervical, supraclavicular, and axillary nodes normal. No abnormal  inguinal nodes palpated Neurologic: Grossly normal   Pelvic: External genitalia:  no lesions              Urethra:  normal appearing urethra with no masses, tenderness or lesions              Bartholins and Skenes: normal                 Vagina: atrophic appearing vagina with normal color and discharge, no lesions              Cervix: no lesions               Bimanual Exam:  Uterus:  no masses or tenderness              Adnexa: no mass, fullness, tenderness               Rectovaginal: Confirms               Anus:  normal sphincter tone, no lesions  Gae Dry chaperoned for the exam.   1. Well woman exam Discussed breast self exam Discussed calcium and vit D intake Mammogram  UTD She will schedule her colonoscopy DEXA with her primary Labs with primary

## 2021-02-20 ENCOUNTER — Other Ambulatory Visit: Payer: Self-pay

## 2021-02-20 ENCOUNTER — Encounter: Payer: Self-pay | Admitting: Obstetrics and Gynecology

## 2021-02-20 ENCOUNTER — Ambulatory Visit (INDEPENDENT_AMBULATORY_CARE_PROVIDER_SITE_OTHER): Payer: 59 | Admitting: Obstetrics and Gynecology

## 2021-02-20 VITALS — BP 122/63 | HR 73 | Ht 63.0 in | Wt 153.0 lb

## 2021-02-20 DIAGNOSIS — Z01419 Encounter for gynecological examination (general) (routine) without abnormal findings: Secondary | ICD-10-CM | POA: Diagnosis not present

## 2021-02-20 NOTE — Patient Instructions (Signed)

## 2021-08-28 ENCOUNTER — Other Ambulatory Visit: Payer: Self-pay | Admitting: Internal Medicine

## 2021-08-28 DIAGNOSIS — Z1231 Encounter for screening mammogram for malignant neoplasm of breast: Secondary | ICD-10-CM

## 2021-10-03 ENCOUNTER — Ambulatory Visit
Admission: RE | Admit: 2021-10-03 | Discharge: 2021-10-03 | Disposition: A | Discharge status: Home / Self Care | Payer: BC Managed Care – PPO | Source: Ambulatory Visit | Attending: Internal Medicine | Admitting: Internal Medicine

## 2021-10-03 ENCOUNTER — Other Ambulatory Visit: Payer: Self-pay

## 2021-10-03 DIAGNOSIS — Z1231 Encounter for screening mammogram for malignant neoplasm of breast: Secondary | ICD-10-CM | POA: Diagnosis not present

## 2021-11-19 DIAGNOSIS — E785 Hyperlipidemia, unspecified: Secondary | ICD-10-CM | POA: Diagnosis not present

## 2021-11-19 DIAGNOSIS — M810 Age-related osteoporosis without current pathological fracture: Secondary | ICD-10-CM | POA: Diagnosis not present

## 2021-11-26 DIAGNOSIS — I10 Essential (primary) hypertension: Secondary | ICD-10-CM | POA: Diagnosis not present

## 2021-11-26 DIAGNOSIS — Z0000 Encounter for general adult medical examination without abnormal findings: Secondary | ICD-10-CM | POA: Diagnosis not present

## 2022-02-14 NOTE — Progress Notes (Signed)
61 y.o. [place] Married White or Caucasian Not Hispanic or Latino female here for annual exam.  No vaginal bleeding. No dyspareunia.   No bowel or bladder c/o.     Patient's last menstrual period was 09/30/2002.          Sexually active: Yes.    The current method of family planning is post menopausal status.    Exercising: Yes.     Has a personal trainer, pickle ball, Tennis, walking daily  Smoker:  no  Health Maintenance: Pap:  11/02/2018 WNL HR HPV Neg. 08-17-15 WNL NEG HR HPV  History of abnormal Pap:  no MMG:  10/03/21 Bi-rads 1 neg  BMD:   11/03/17 Osteoporotic, T score -3 in her forearm, spine -2, femur -0.6. She hasn't tolerated bisphosphonate in the past.  Colonoscopy: 2016 repeat in 5 years, has an appointment   TDaP:  08/29/16  Gardasil: n/a   reports that she has quit smoking. She has never used smokeless tobacco. She reports current alcohol use of about 7.0 standard drinks per week. She reports that she does not use drugs. 2 adult daughters (adopted from [place]). One daughter is  married. Celebrating her 40th anniversary this summer.  Has a Restaurant manager, fast food   Past Medical History:  Diagnosis Date   Endometriosis    Hypertension    Infertility, female    Osteoporosis     Past Surgical History:  Procedure Laterality Date   LAPAROSCOPY     TONSILLECTOMY     as a child     Current Outpatient Medications  Medication Sig Dispense Refill   amLODipine (NORVASC) 2.5 MG tablet daily.      cholecalciferol (VITAMIN D) 1000 units tablet Take 1,000 Units by mouth daily.     losartan-hydrochlorothiazide (HYZAAR) 100-12.5 MG per tablet Take 1 tablet by mouth daily.      Multiple Vitamins-Minerals (MULTIVITAMIN ADULT PO) Take by mouth daily.     rosuvastatin (CRESTOR) 5 MG tablet Take 5 mg by mouth daily.  3   TRANSDERM-SCOP 1.5 MG 1 patch as needed.      zolpidem (AMBIEN) 10 MG tablet Take 10 mg by mouth as needed.      No current facility-administered medications for this  visit.    Family History  Problem Relation Age of Onset   Hypertension Mother    Hypertension Father    Stroke Father    Hypertension Brother    Hypertension Brother    Hypertension Brother     Review of Systems  All other systems reviewed and are negative.  Exam:   LMP 09/30/2002   Weight change: '@WEIGHTCHANGE'$ @ Height:      Ht Readings from Last 3 Encounters:  02/20/21 '5\' 3"'$  (1.6 m)  12/13/19 5' 2.25" (1.581 m)  11/02/18 '5\' 3"'$  (1.6 m)    General appearance: alert, cooperative and appears stated age Head: Normocephalic, without obvious abnormality, atraumatic Neck: no adenopathy, supple, symmetrical, trachea midline and thyroid normal to inspection and palpation Lungs: clear to auscultation bilaterally Cardiovascular: regular rate and rhythm Breasts: normal appearance, no masses or tenderness Abdomen: soft, non-tender; non distended,  no masses,  no organomegaly Extremities: extremities normal, atraumatic, no cyanosis or edema Skin: Skin color, texture, turgor normal. No rashes or lesions Lymph nodes: Cervical, supraclavicular, and axillary nodes normal. No abnormal inguinal nodes palpated Neurologic: Grossly normal   Pelvic: External genitalia:  no lesions              Urethra:  normal appearing urethra with  no masses, tenderness or lesions              Bartholins and Skenes: normal                 Vagina: normal appearing vagina with normal color and discharge, no lesions              Cervix: no lesions               Bimanual Exam:  Uterus:  normal size, contour, position, consistency, mobility, non-tender              Adnexa: no mass, fullness, tenderness               Rectovaginal: Confirms               Anus:  normal sphincter tone, no lesions  Gae Dry chaperoned for the exam.  1. Well woman exam Discussed breast self exam Discussed calcium and vit D intake Mammogram UTD Colonoscopy scheduled Labs with primary   2. Age-related osteoporosis without  current pathological fracture On calcium and vit d, exercising Previously didn't tolerate bisphosphonates - DG Bone Density; Future

## 2022-02-21 ENCOUNTER — Encounter: Payer: Self-pay | Admitting: Obstetrics and Gynecology

## 2022-02-21 ENCOUNTER — Ambulatory Visit (INDEPENDENT_AMBULATORY_CARE_PROVIDER_SITE_OTHER): Payer: BC Managed Care – PPO | Admitting: Obstetrics and Gynecology

## 2022-02-21 ENCOUNTER — Ambulatory Visit: Payer: 59 | Admitting: Obstetrics and Gynecology

## 2022-02-21 VITALS — BP 124/76 | HR 79 | Ht 62.6 in | Wt 158.0 lb

## 2022-02-21 DIAGNOSIS — Z01419 Encounter for gynecological examination (general) (routine) without abnormal findings: Secondary | ICD-10-CM

## 2022-02-21 DIAGNOSIS — M810 Age-related osteoporosis without current pathological fracture: Secondary | ICD-10-CM

## 2022-03-01 DIAGNOSIS — D175 Benign lipomatous neoplasm of intra-abdominal organs: Secondary | ICD-10-CM | POA: Diagnosis not present

## 2022-03-01 DIAGNOSIS — D121 Benign neoplasm of appendix: Secondary | ICD-10-CM | POA: Diagnosis not present

## 2022-03-01 DIAGNOSIS — D123 Benign neoplasm of transverse colon: Secondary | ICD-10-CM | POA: Diagnosis not present

## 2022-03-01 DIAGNOSIS — K5730 Diverticulosis of large intestine without perforation or abscess without bleeding: Secondary | ICD-10-CM | POA: Diagnosis not present

## 2022-03-01 DIAGNOSIS — K649 Unspecified hemorrhoids: Secondary | ICD-10-CM | POA: Diagnosis not present

## 2022-03-01 DIAGNOSIS — Z86010 Personal history of colonic polyps: Secondary | ICD-10-CM | POA: Diagnosis not present

## 2022-03-06 ENCOUNTER — Ambulatory Visit
Admission: RE | Admit: 2022-03-06 | Discharge: 2022-03-06 | Disposition: A | Discharge status: Home / Self Care | Payer: BC Managed Care – PPO | Source: Ambulatory Visit | Attending: Obstetrics and Gynecology | Admitting: Obstetrics and Gynecology

## 2022-03-06 DIAGNOSIS — Z780 Asymptomatic menopausal state: Secondary | ICD-10-CM | POA: Diagnosis not present

## 2022-03-06 DIAGNOSIS — M810 Age-related osteoporosis without current pathological fracture: Secondary | ICD-10-CM | POA: Diagnosis not present

## 2022-03-14 ENCOUNTER — Ambulatory Visit: Payer: BC Managed Care – PPO | Admitting: Obstetrics and Gynecology

## 2022-03-14 ENCOUNTER — Ambulatory Visit (INDEPENDENT_AMBULATORY_CARE_PROVIDER_SITE_OTHER): Payer: BC Managed Care – PPO | Admitting: Obstetrics and Gynecology

## 2022-03-14 VITALS — BP 120/62 | HR 77 | Wt 159.0 lb

## 2022-03-14 DIAGNOSIS — M810 Age-related osteoporosis without current pathological fracture: Secondary | ICD-10-CM

## 2022-03-14 NOTE — Progress Notes (Unsigned)
GYNECOLOGY  VISIT   HPI: 61 y.o.   Married White or Caucasian Not Hispanic or Latino  female   G0P0000 with Patient's last menstrual period was 09/30/2002.   here to discuss her dexa results.  DEXA 03/06/22:  Forearm T score of -3.1, AP spine -2.5 (significant decrease). She has previously tried a bisphosphonate and developed GI side effects and nausea. She was given a trial of actonel which gave her similar side effects.  She saw Dr Estanislado Pandy in 3/19 to discuss osteoporosis. At that time she wasn't taking Ca and Vit d, she was advised to start it. She did lab work and discussed Reclast with her. The patient decided to hold on the Reclast.  Blood work at that time was normal (CBC, CMP, PTH, TSH, vit d)  H/o menopause in her 72's.   She is taking one calcium and vit d supplement, also taking magnesium. She gets calcium and vit d in her diet. Non smoker, 2-3 glasses of wine a night.   She just had blood work with her primary.   GYNECOLOGIC HISTORY: Patient's last menstrual period was 09/30/2002. Contraception:PMP Menopausal hormone therapy: none        OB History     Gravida  0   Para  0   Term  0   Preterm  0   AB  0   Living  0      SAB  0   IAB  0   Ectopic  0   Multiple  0   Live Births           Obstetric Comments  2 adopted girls            Patient Active Problem List   Diagnosis Date Noted   Low back pain 11/06/2018   Essential hypertension, benign 07/14/2013   Osteoporosis, unspecified 07/14/2013   Age-related osteoporosis without current pathological fracture 08/07/2012   Hyperlipidemia 08/07/2012   Benign neoplasm of colon 02/05/2012   Glaucoma 02/05/2012    Past Medical History:  Diagnosis Date   Endometriosis    Hypertension    Infertility, female    Osteoporosis     Past Surgical History:  Procedure Laterality Date   LAPAROSCOPY     TONSILLECTOMY     as a child     Current Outpatient Medications  Medication Sig Dispense  Refill   amLODipine (NORVASC) 2.5 MG tablet daily.      calcium-vitamin D (OSCAL WITH D) 500-5 MG-MCG tablet Take 1 tablet by mouth.     cholecalciferol (VITAMIN D) 1000 units tablet Take 1,000 Units by mouth daily.     losartan-hydrochlorothiazide (HYZAAR) 100-12.5 MG per tablet Take 1 tablet by mouth daily.      Multiple Vitamins-Minerals (MULTIVITAMIN ADULT PO) Take by mouth daily.     rosuvastatin (CRESTOR) 5 MG tablet Take 5 mg by mouth daily.  3   TRANSDERM-SCOP 1.5 MG 1 patch as needed.      zolpidem (AMBIEN) 10 MG tablet Take 10 mg by mouth as needed.      No current facility-administered medications for this visit.     ALLERGIES: Patient has no known allergies.  Family History  Problem Relation Age of Onset   Hypertension Mother    Hypertension Father    Stroke Father    Hypertension Brother    Hypertension Brother    Hypertension Brother     Social History   Socioeconomic History   Marital status: Married    Spouse  name: Not on file   Number of children: Not on file   Years of education: Not on file   Highest education level: Not on file  Occupational History   Not on file  Tobacco Use   Smoking status: Former   Smokeless tobacco: Never  Vaping Use   Vaping Use: Never used  Substance and Sexual Activity   Alcohol use: Yes    Alcohol/week: 7.0 standard drinks of alcohol    Types: 7 Standard drinks or equivalent per week   Drug use: No   Sexual activity: Yes    Partners: Male    Birth control/protection: Post-menopausal  Other Topics Concern   Not on file  Social History Narrative   Not on file   Social Determinants of Health   Financial Resource Strain: Not on file  Food Insecurity: Not on file  Transportation Needs: Not on file  Physical Activity: Not on file  Stress: Not on file  Social Connections: Not on file  Intimate Partner Violence: Not on file    ROS  PHYSICAL EXAMINATION:    BP 120/62   Pulse 77   Wt 159 lb (72.1 kg)   LMP  09/30/2002   SpO2 100%   BMI 28.53 kg/m     General appearance: alert, cooperative and appears stated age  50. Age-related osteoporosis without current pathological fracture -Reviewed her DEXA -Discussed calcium and vit D intake -Discussed exercise -Discussed decreasing ETOH use -Discussed recommendation for treatment. She previously hasn't tolerated oral bisphosphonate's. Reviewed Reclast and Prolia. -Reviewed risks of Reclast, including but not limited to: acute kidney injury, a. Fib, osteonecrosis of the jaw, atypical fractures, muscle pain and acute phase reaction. Discussed the low risk of the serious side effects. Discussed typical treatment is yearly for 3 years. -Discussed prolia, reviewed that it is not recommended as the front line treatment given the rapid decrease in bone density in the vertebrate after stopping it.  -We have requested lab work from her primary.  Need to confirm that she has had a CBC, CMP, vit D, magnesium, PO4. If not, will order them. -As long as her lab work is normal, will order Reclast.  -ACOG handout on osteoporosis was given  Addendum: Labs from primary received. Drawn on 11/19/21 Creatinine 0.7, GFR 85 Calcium 9.7 Normal lft's TSH 0.98 Normal CBC Cholesterol 240, HDL 92, LDL 134  Will order vit d, magnesium and phosphorous.Then will order the reclast.

## 2022-03-19 ENCOUNTER — Encounter: Payer: Self-pay | Admitting: Obstetrics and Gynecology

## 2022-03-19 ENCOUNTER — Telehealth: Payer: Self-pay | Admitting: Obstetrics and Gynecology

## 2022-03-19 NOTE — Telephone Encounter (Signed)
Please let the patient know that I have reviewed the labs from her primary. I have ordered a few more labs. Please schedule her for a lab visit. Once those are back we will work on getting her scheduled for Reclast.

## 2022-03-20 NOTE — Telephone Encounter (Signed)
Patient informed, lab appointment scheduled.

## 2022-03-26 ENCOUNTER — Other Ambulatory Visit: Payer: BC Managed Care – PPO

## 2022-03-26 DIAGNOSIS — M810 Age-related osteoporosis without current pathological fracture: Secondary | ICD-10-CM | POA: Diagnosis not present

## 2022-03-27 LAB — PHOSPHORUS: Phosphorus: 4.5 mg/dL (ref 2.5–4.5)

## 2022-03-27 LAB — VITAMIN D 25 HYDROXY (VIT D DEFICIENCY, FRACTURES): Vit D, 25-Hydroxy: 38 ng/mL (ref 30–100)

## 2022-03-27 LAB — MAGNESIUM: Magnesium: 2.2 mg/dL (ref 1.5–2.5)

## 2022-04-03 ENCOUNTER — Telehealth: Payer: Self-pay | Admitting: *Deleted

## 2022-04-03 DIAGNOSIS — M810 Age-related osteoporosis without current pathological fracture: Secondary | ICD-10-CM

## 2022-04-03 NOTE — Telephone Encounter (Signed)
Thamas Jaegers, RMA sent to Enrigue Catena, RMA Per result note from [facility]  "Please set her up for a reclast infusion for osteoporosis."

## 2022-04-03 NOTE — Telephone Encounter (Addendum)
Reclast instructions   Annual Exam (1 year) 02/21/2022  Called pt to verify insurance coverage / inform pt Reclast is in Process ?  Labs must be in 30 days window Serum Creatinine 0.76  DATE? 9 Serum Calcium 9.8  DATE? 06/04/2022   PA needed? YES [phone]  Approved valid 04/03/2022-04/03/2023 reference # [id number]  Auth scanned in system   Co-insurance ? 75/25 Deductible $4900 ($1171.10) OOPM $7800 ($1855.01) Reference # [id number]  Cost [phone] (pt has not met ded )if ded was met it would be $14.75  Order form filled out and faxed  w/MD sig?  Infusion [facility] done at ? [facility]  Appt 06/24/2022 at 9:00  Pt aware? yes  Instructions mailed to pt? Yes

## 2022-04-19 NOTE — Telephone Encounter (Signed)
Pt states she will call us back in regards to scheduling the lab appt

## 2022-04-19 NOTE — Telephone Encounter (Signed)
Lab appt 06/24/2022

## 2022-05-25 IMAGING — MG MM DIGITAL SCREENING BILAT W/ TOMO AND CAD
8 series · 8 of 24 positions shown · non-contrast
Comparison: Previous exam(s).

CLINICAL DATA: Screening.

EXAM:
DIGITAL SCREENING BILATERAL MAMMOGRAM WITH TOMOSYNTHESIS AND CAD
TECHNIQUE: Bilateral screening digital craniocaudal and mediolateral oblique
mammograms were obtained. Bilateral screening digital breast
tomosynthesis was performed. The images were evaluated with
computer-aided detection.

[R CC synth-2D]
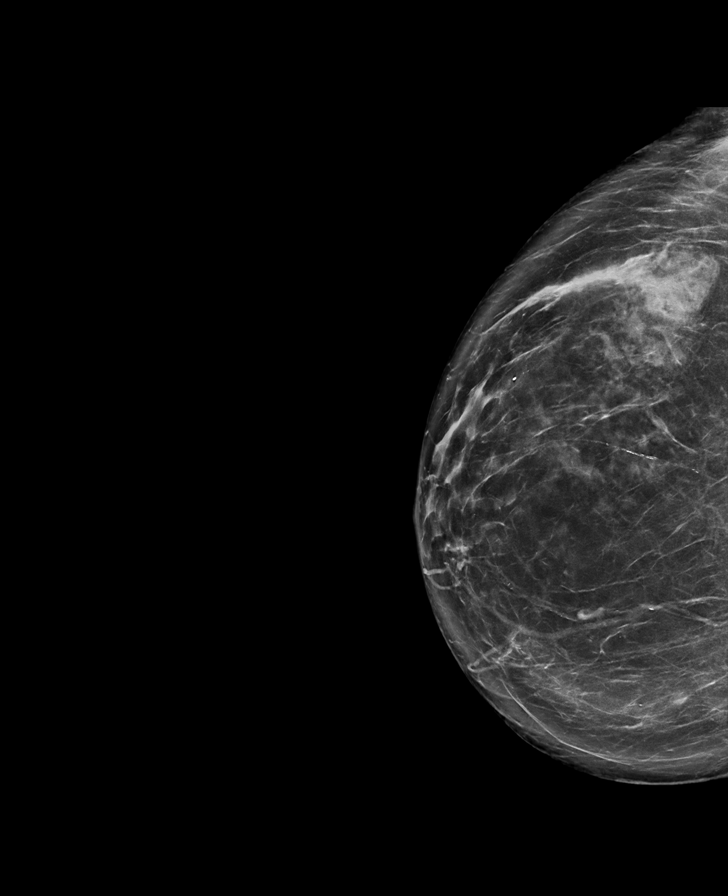

[L MLO synth-2D]
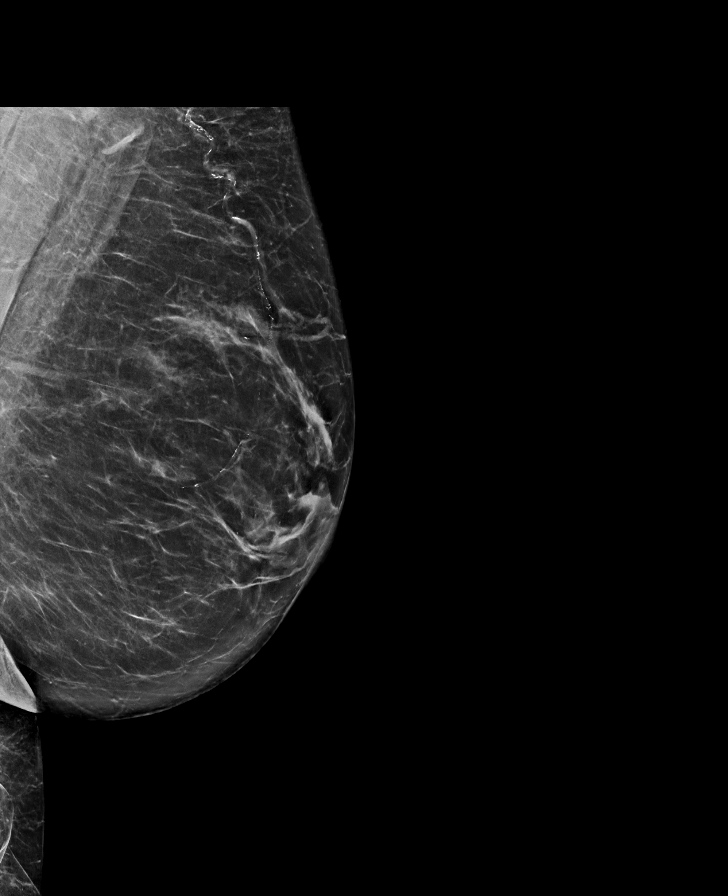

[R MLO synth-2D]
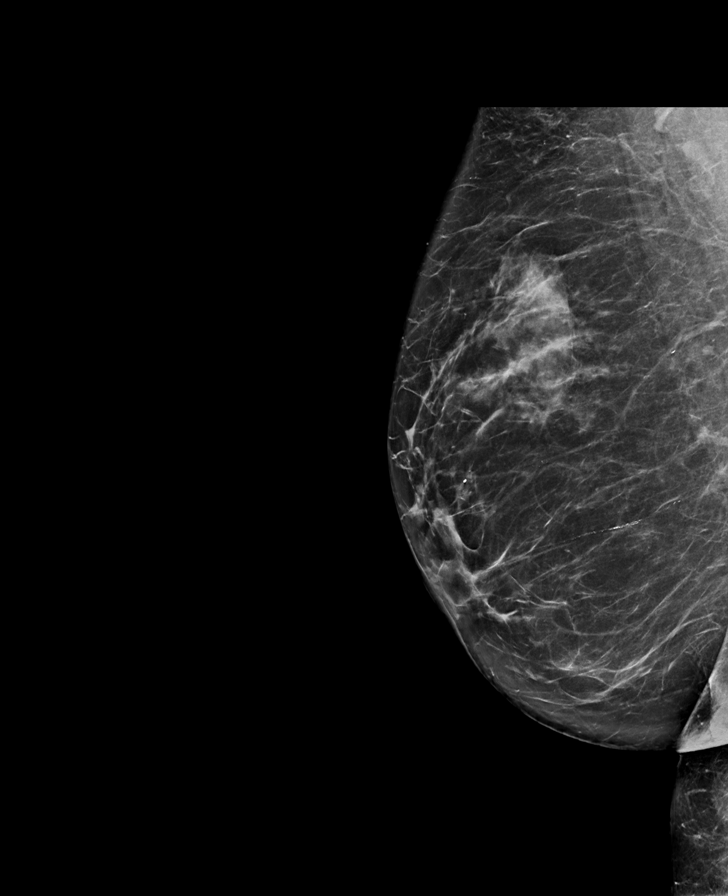

[L CC synth-2D]
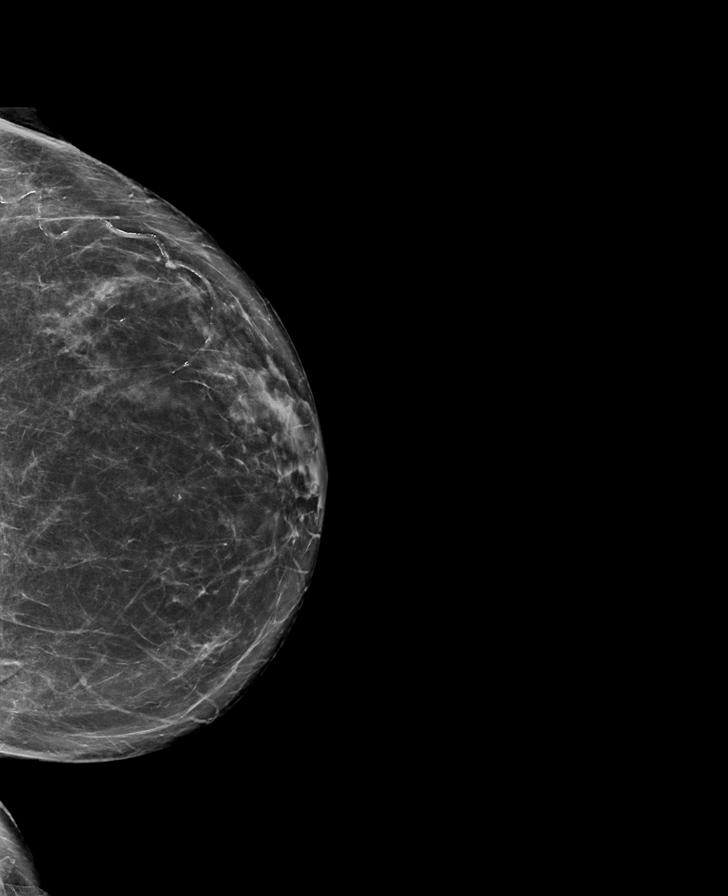

[R CC tomo · tomo slice 43/85.0]
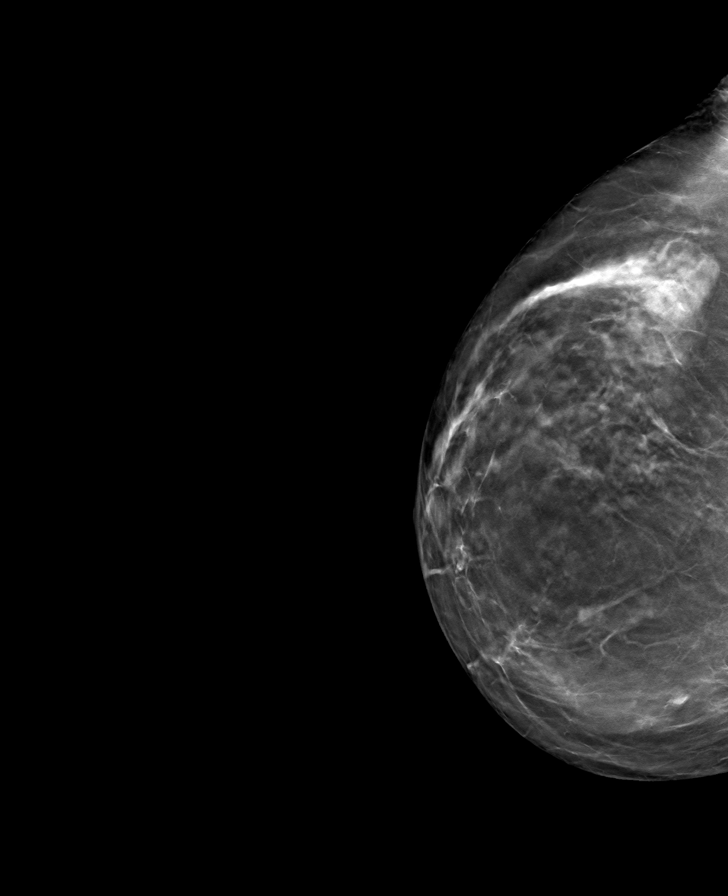

[R MLO tomo · tomo slice 43/85.0]
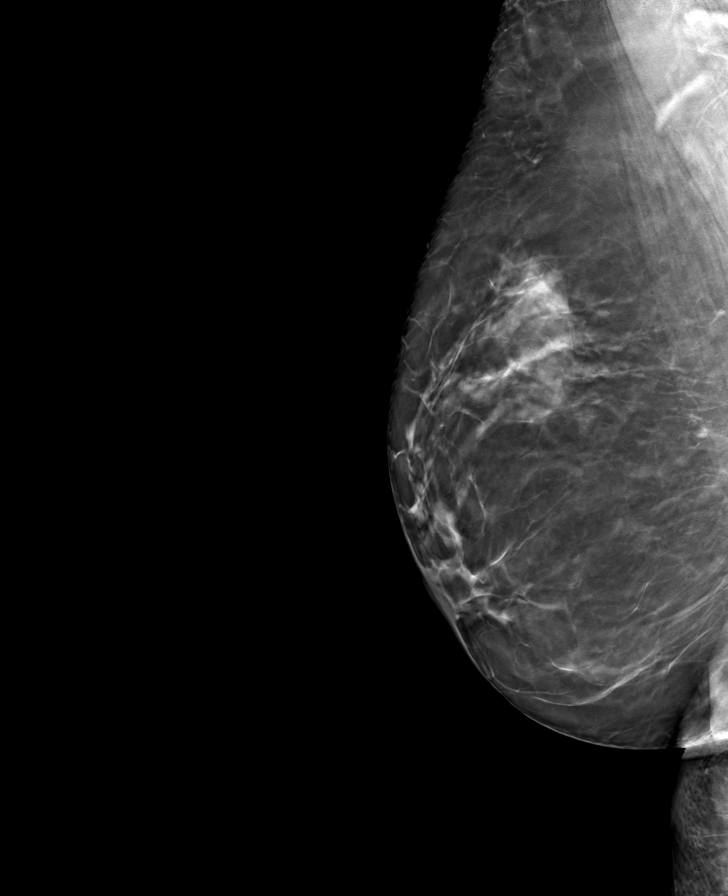

[L CC tomo · tomo slice 42/83.0]
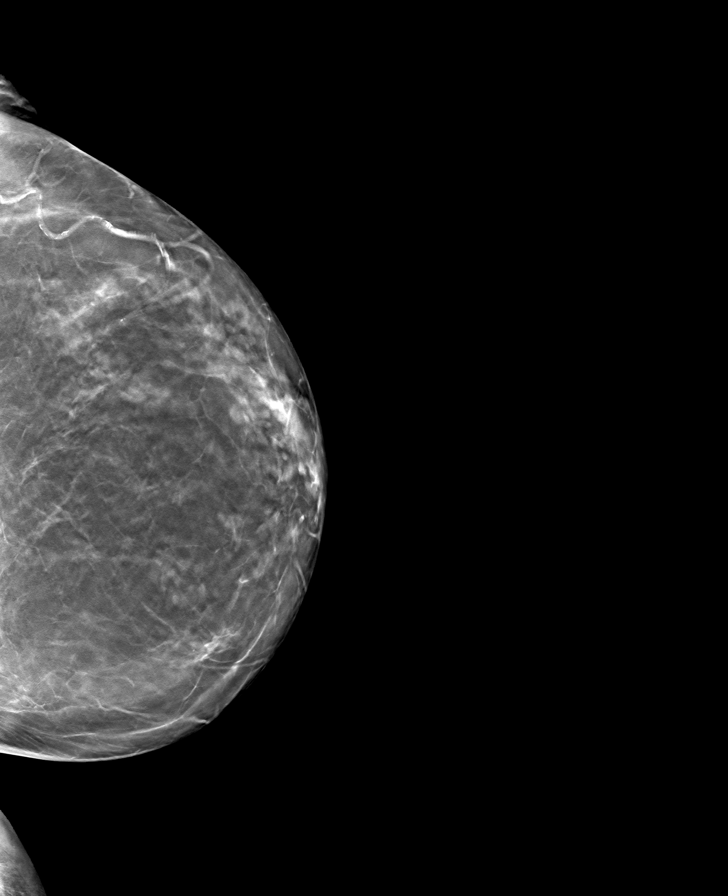

[L MLO tomo · tomo slice 43/85.0]
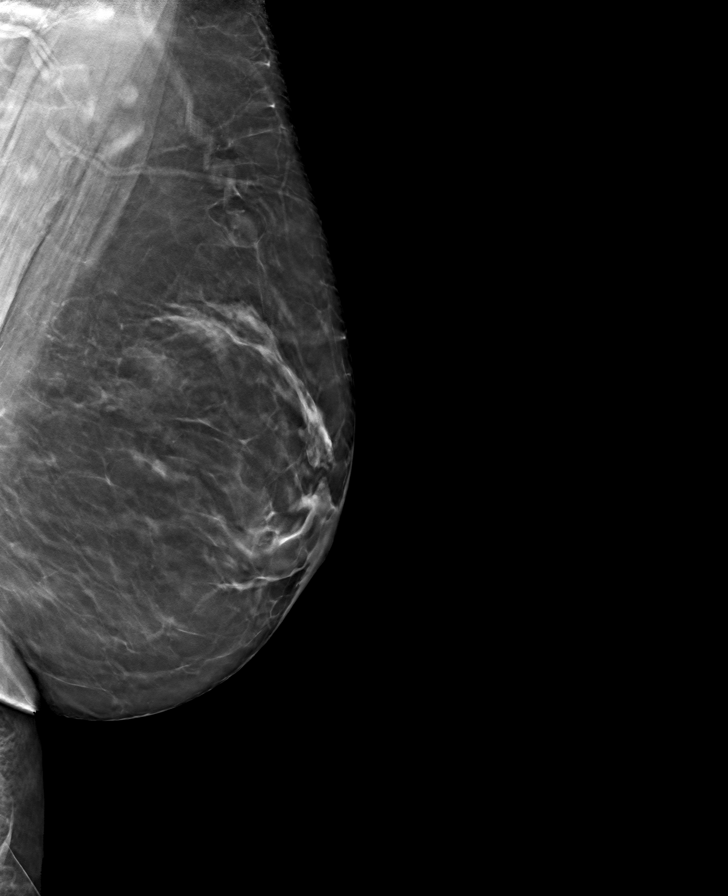

[8 of 24 positions shown; findings below may reference images not displayed]

ACR Breast Density Category b: There are scattered areas of
fibroglandular density.
FINDINGS: There are no findings suspicious for malignancy.
IMPRESSION: No mammographic evidence of malignancy. A result letter of this
screening mammogram will be mailed directly to the patient.

RECOMMENDATION:
Screening mammogram in one year. (Code:51-O-LD2)

BI-RADS CATEGORY  1: Negative.

## 2022-06-04 ENCOUNTER — Other Ambulatory Visit: Payer: BC Managed Care – PPO

## 2022-06-04 DIAGNOSIS — L853 Xerosis cutis: Secondary | ICD-10-CM | POA: Diagnosis not present

## 2022-06-04 DIAGNOSIS — Z85828 Personal history of other malignant neoplasm of skin: Secondary | ICD-10-CM | POA: Diagnosis not present

## 2022-06-04 DIAGNOSIS — D2372 Other benign neoplasm of skin of left lower limb, including hip: Secondary | ICD-10-CM | POA: Diagnosis not present

## 2022-06-04 DIAGNOSIS — L570 Actinic keratosis: Secondary | ICD-10-CM | POA: Diagnosis not present

## 2022-06-04 DIAGNOSIS — D225 Melanocytic nevi of trunk: Secondary | ICD-10-CM | POA: Diagnosis not present

## 2022-06-04 DIAGNOSIS — M810 Age-related osteoporosis without current pathological fracture: Secondary | ICD-10-CM

## 2022-06-04 DIAGNOSIS — L820 Inflamed seborrheic keratosis: Secondary | ICD-10-CM | POA: Diagnosis not present

## 2022-06-05 LAB — CREATININE, SERUM: Creat: 0.76 mg/dL (ref 0.50–1.05)

## 2022-06-05 LAB — CALCIUM: Calcium: 9.8 mg/dL (ref 8.6–10.4)

## 2022-06-19 ENCOUNTER — Other Ambulatory Visit (HOSPITAL_COMMUNITY): Payer: Self-pay | Admitting: *Deleted

## 2022-06-24 ENCOUNTER — Ambulatory Visit (HOSPITAL_COMMUNITY)
Admission: RE | Admit: 2022-06-24 | Discharge: 2022-06-24 | Disposition: A | Discharge status: Home / Self Care | Payer: BC Managed Care – PPO | Source: Ambulatory Visit | Attending: Obstetrics and Gynecology | Admitting: Obstetrics and Gynecology

## 2022-06-24 DIAGNOSIS — M810 Age-related osteoporosis without current pathological fracture: Secondary | ICD-10-CM | POA: Insufficient documentation

## 2022-06-24 MED ORDER — ZOLEDRONIC ACID 5 MG/100ML IV SOLN
INTRAVENOUS | Status: AC
  Administered 2022-06-24: 5 mg via INTRAVENOUS
  Filled 2022-06-24: qty 100

## 2022-06-24 MED ORDER — ZOLEDRONIC ACID 5 MG/100ML IV SOLN: 5.0000 mg | Freq: Once | INTRAVENOUS | Status: AC

## 2022-07-23 DIAGNOSIS — H40033 Anatomical narrow angle, bilateral: Secondary | ICD-10-CM | POA: Diagnosis not present

## 2022-09-09 ENCOUNTER — Other Ambulatory Visit: Payer: Self-pay | Admitting: Internal Medicine

## 2022-09-09 DIAGNOSIS — Z1231 Encounter for screening mammogram for malignant neoplasm of breast: Secondary | ICD-10-CM

## 2022-09-30 DIAGNOSIS — S42309A Unspecified fracture of shaft of humerus, unspecified arm, initial encounter for closed fracture: Secondary | ICD-10-CM

## 2022-09-30 HISTORY — DX: Unspecified fracture of shaft of humerus, unspecified arm, initial encounter for closed fracture: S42.309A

## 2022-11-11 ENCOUNTER — Ambulatory Visit
Admission: RE | Admit: 2022-11-11 | Discharge: 2022-11-11 | Disposition: A | Discharge status: Home / Self Care | Payer: BC Managed Care – PPO | Source: Ambulatory Visit | Attending: Internal Medicine | Admitting: Internal Medicine

## 2022-11-11 DIAGNOSIS — Z1231 Encounter for screening mammogram for malignant neoplasm of breast: Secondary | ICD-10-CM | POA: Diagnosis not present

## 2022-11-25 DIAGNOSIS — I10 Essential (primary) hypertension: Secondary | ICD-10-CM | POA: Diagnosis not present

## 2022-11-25 DIAGNOSIS — M810 Age-related osteoporosis without current pathological fracture: Secondary | ICD-10-CM | POA: Diagnosis not present

## 2022-11-25 DIAGNOSIS — E785 Hyperlipidemia, unspecified: Secondary | ICD-10-CM | POA: Diagnosis not present

## 2022-11-25 DIAGNOSIS — D72819 Decreased white blood cell count, unspecified: Secondary | ICD-10-CM | POA: Diagnosis not present

## 2022-11-25 DIAGNOSIS — R7989 Other specified abnormal findings of blood chemistry: Secondary | ICD-10-CM | POA: Diagnosis not present

## 2022-11-25 LAB — LAB REPORT - SCANNED: EGFR (Non-African Amer.): 84.8

## 2022-12-02 DIAGNOSIS — Z1331 Encounter for screening for depression: Secondary | ICD-10-CM | POA: Diagnosis not present

## 2022-12-02 DIAGNOSIS — Z0000 Encounter for general adult medical examination without abnormal findings: Secondary | ICD-10-CM | POA: Diagnosis not present

## 2022-12-02 DIAGNOSIS — I10 Essential (primary) hypertension: Secondary | ICD-10-CM | POA: Diagnosis not present

## 2022-12-02 DIAGNOSIS — E785 Hyperlipidemia, unspecified: Secondary | ICD-10-CM | POA: Diagnosis not present

## 2022-12-02 DIAGNOSIS — D72819 Decreased white blood cell count, unspecified: Secondary | ICD-10-CM | POA: Diagnosis not present

## 2022-12-02 DIAGNOSIS — Z1339 Encounter for screening examination for other mental health and behavioral disorders: Secondary | ICD-10-CM | POA: Diagnosis not present

## 2022-12-02 DIAGNOSIS — M810 Age-related osteoporosis without current pathological fracture: Secondary | ICD-10-CM | POA: Diagnosis not present

## 2023-01-29 DIAGNOSIS — M25512 Pain in left shoulder: Secondary | ICD-10-CM | POA: Diagnosis not present

## 2023-02-03 DIAGNOSIS — M25512 Pain in left shoulder: Secondary | ICD-10-CM | POA: Diagnosis not present

## 2023-02-04 DIAGNOSIS — H40033 Anatomical narrow angle, bilateral: Secondary | ICD-10-CM | POA: Diagnosis not present

## 2023-02-04 DIAGNOSIS — H4089 Other specified glaucoma: Secondary | ICD-10-CM | POA: Diagnosis not present

## 2023-02-04 DIAGNOSIS — M25512 Pain in left shoulder: Secondary | ICD-10-CM | POA: Insufficient documentation

## 2023-02-05 DIAGNOSIS — M25512 Pain in left shoulder: Secondary | ICD-10-CM | POA: Diagnosis not present

## 2023-02-19 NOTE — Progress Notes (Addendum)
62 y.o. G0P0000 Married White or Caucasian Not Hispanic or Latino female here for annual exam.  Patient states that she thinks she is due for her Reclast.   She would like to also talk about HRT. Not having vasomotor symptoms.   No vaginal bleeding. No bowel or bladder c/o.    Patient's last menstrual period was 09/30/2002.          Sexually active: Yes.    The current method of family planning is post menopausal status.    Exercising: Yes.     Still working with trainer  Smoker:  no  Health Maintenance: Pap:  11/02/2018 WNL HR HPV Neg. 08-17-15 WNL NEG HR HPV   History of abnormal Pap:  no MMG:  11/16/22 density B Bi-rads 1 neg  BMD:   03/06/22 osteoporotic, treated with reclast in 9/23 (1st infusion). Colonoscopy:  2023, polyp, needs f/u in 2 years.  TDaP:  08/29/16 Gardasil: n/a   reports that she has quit smoking. She has never used smokeless tobacco. She reports current alcohol use of about 7.0 standard drinks of alcohol per week. She reports that she does not use drugs. 2 daughters, no grandchildren.   Past Medical History:  Diagnosis Date   Endometriosis    Hypertension    Infertility, female    Osteoporosis     Past Surgical History:  Procedure Laterality Date   LAPAROSCOPY     TONSILLECTOMY     as a child     Current Outpatient Medications  Medication Sig Dispense Refill   amLODipine (NORVASC) 2.5 MG tablet daily.      calcium-vitamin D (OSCAL WITH D) 500-5 MG-MCG tablet Take 1 tablet by mouth.     cholecalciferol (VITAMIN D) 1000 units tablet Take 1,000 Units by mouth daily.     losartan-hydrochlorothiazide (HYZAAR) 100-12.5 MG per tablet Take 1 tablet by mouth daily.      Multiple Vitamins-Minerals (MULTIVITAMIN ADULT PO) Take by mouth daily.     naproxen sodium (ALEVE) 220 MG tablet Take 220 mg by mouth.     rosuvastatin (CRESTOR) 10 MG tablet Take 10 mg by mouth daily.     TRANSDERM-SCOP 1.5 MG 1 patch as needed.      zolpidem (AMBIEN) 10 MG tablet Take 10 mg  by mouth as needed.      No current facility-administered medications for this visit.    Family History  Problem Relation Age of Onset   Hypertension Mother    Hypertension Father    Stroke Father    Hypertension Brother    Hypertension Brother    Hypertension Brother     Review of Systems  All other systems reviewed and are negative.   Exam:   BP 122/68   Pulse 74   Ht 5' 2.5" (1.588 m)   Wt 162 lb (73.5 kg)   LMP 09/30/2002   SpO2 99%   BMI 29.16 kg/m   Weight change: @WEIGHTCHANGE @ Height:   Height: 5' 2.5" (158.8 cm)  Ht Readings from Last 3 Encounters:  02/25/23 5' 2.5" (1.588 m)  02/21/22 5' 2.6" (1.59 m)  02/20/21 5\' 3"  (1.6 m)    General appearance: alert, cooperative and appears stated age Head: Normocephalic, without obvious abnormality, atraumatic Neck: no adenopathy, supple, symmetrical, trachea midline and thyroid normal to inspection and palpation Lungs: clear to auscultation bilaterally Cardiovascular: regular rate and rhythm Breasts: normal appearance, no masses or tenderness Abdomen: soft, non-tender; non distended,  no masses,  no organomegaly Extremities: extremities normal,  atraumatic, no cyanosis or edema Skin: Skin color, texture, turgor normal. No rashes or lesions Lymph nodes: Cervical, supraclavicular, and axillary nodes normal. No abnormal inguinal nodes palpated Neurologic: Grossly normal   Pelvic: External genitalia:  no lesions              Urethra:  normal appearing urethra with no masses, tenderness or lesions              Bartholins and Skenes: normal                 Vagina: normal appearing vagina with normal color and discharge, no lesions              Cervix: no lesions and stenotic               Bimanual Exam:  Uterus:   no masses or tenderness              Adnexa: no mass, fullness, tenderness               Rectovaginal: Confirms               Anus:  normal sphincter tone, no lesions  Carolynn Serve, CMA chaperoned for the  exam.  1. Well woman exam Discussed breast self exam Mammogram UTD Colonoscopy UTD Labs with primary  2. Screening for cervical cancer - Cytology - PAP  3. Age-related osteoporosis without current pathological fracture Reclast is due in 9/24 Will get a copy of her recent lab work Getting calcium and vit d DEXA due in 6/25  02/28/23 Addendum: Labs from 11/25/22 reviewed and scanned.

## 2023-02-25 ENCOUNTER — Other Ambulatory Visit (HOSPITAL_COMMUNITY)
Admission: RE | Admit: 2023-02-25 | Discharge: 2023-02-25 | Disposition: A | Discharge status: Home / Self Care | Payer: BC Managed Care – PPO | Source: Ambulatory Visit | Attending: Obstetrics and Gynecology | Admitting: Obstetrics and Gynecology

## 2023-02-25 ENCOUNTER — Ambulatory Visit (INDEPENDENT_AMBULATORY_CARE_PROVIDER_SITE_OTHER): Payer: BC Managed Care – PPO | Admitting: Obstetrics and Gynecology

## 2023-02-25 VITALS — BP 122/68 | HR 74 | Ht 62.5 in | Wt 162.0 lb

## 2023-02-25 DIAGNOSIS — Z124 Encounter for screening for malignant neoplasm of cervix: Secondary | ICD-10-CM

## 2023-02-25 DIAGNOSIS — Z01419 Encounter for gynecological examination (general) (routine) without abnormal findings: Secondary | ICD-10-CM | POA: Diagnosis not present

## 2023-02-25 DIAGNOSIS — M810 Age-related osteoporosis without current pathological fracture: Secondary | ICD-10-CM

## 2023-02-25 NOTE — Patient Instructions (Signed)
F/U with Dr Edward Jolly next year  EXERCISE   We recommended that you start or continue a regular exercise program for good health. Physical activity is anything that gets your body moving, some is better than none. The CDC recommends 150 minutes per week of Moderate-Intensity Aerobic Activity and 2 or more days of Muscle Strengthening Activity.  Benefits of exercise are limitless: helps weight loss/weight maintenance, improves mood and energy, helps with depression and anxiety, improves sleep, tones and strengthens muscles, improves balance, improves bone density, protects from chronic conditions such as heart disease, high blood pressure and diabetes and so much more. To learn more visit: http://kirby-bean.org/  DIET: Good nutrition starts with a healthy diet of fruits, vegetables, whole grains, and lean protein sources. Drink plenty of water for hydration. Minimize empty calories, sodium, sweets. For more information about dietary recommendations visit: CriticalGas.be and https://www.carpenter-henry.info/  ALCOHOL:  Women should limit their alcohol intake to no more than 7 drinks/beers/glasses of wine (combined, not each!) per week. Moderation of alcohol intake to this level decreases your risk of breast cancer and liver damage.  If you are concerned that you may have a problem, or your friends have told you they are concerned about your drinking, there are many resources to help. A well-known program that is free, effective, and available to all people all over the nation is Alcoholics Anonymous.  Check out this site to learn more: BeverageBargains.co.za   CALCIUM AND VITAMIN D:  Adequate intake of calcium and Vitamin D are recommended for bone health.  You should be getting between 1000-1200 mg of calcium and 800 units of Vitamin D daily between diet and supplements  PAP SMEARS:  Pap smears, to check for cervical cancer or  precancers,  have traditionally been done yearly, scientific advances have shown that most women can have pap smears less often.  However, every woman still should have a physical exam from her gynecologist every year. It will include a breast check, inspection of the vulva and vagina to check for abnormal growths or skin changes, a visual exam of the cervix, and then an exam to evaluate the size and shape of the uterus and ovaries. We will also provide age appropriate advice regarding health maintenance, like when you should have certain vaccines, screening for sexually transmitted diseases, bone density testing, colonoscopy, mammograms, etc.   MAMMOGRAMS:  All women over 8 years old should have a routine mammogram.   COLON CANCER SCREENING: Now recommend starting at age 31. At this time colonoscopy is not covered for routine screening until 50. There are take home tests that can be done between 45-49.   COLONOSCOPY:  Colonoscopy to screen for colon cancer is recommended for all women at age 16.  We know, you hate the idea of the prep.  We agree, BUT, having colon cancer and not knowing it is worse!!  Colon cancer so often starts as a polyp that can be seen and removed at colonscopy, which can quite literally save your life!  And if your first colonoscopy is normal and you have no family history of colon cancer, most women don't have to have it again for 10 years.  Once every ten years, you can do something that may end up saving your life, right?  We will be happy to help you get it scheduled when you are ready.  Be sure to check your insurance coverage so you understand how much it will cost.  It may be covered as a preventative service  at no cost, but you should check your particular policy.      Breast Self-Awareness Breast self-awareness means being familiar with how your breasts look and feel. It involves checking your breasts regularly and reporting any changes to your health care  provider. Practicing breast self-awareness is important. A change in your breasts can be a sign of a serious medical problem. Being familiar with how your breasts look and feel allows you to find any problems early, when treatment is more likely to be successful. All women should practice breast self-awareness, including women who have had breast implants. How to do a breast self-exam One way to learn what is normal for your breasts and whether your breasts are changing is to do a breast self-exam. To do a breast self-exam: Look for Changes  Remove all the clothing above your waist. Stand in front of a mirror in a room with good lighting. Put your hands on your hips. Push your hands firmly downward. Compare your breasts in the mirror. Look for differences between them (asymmetry), such as: Differences in shape. Differences in size. Puckers, dips, and bumps in one breast and not the other. Look at each breast for changes in your skin, such as: Redness. Scaly areas. Look for changes in your nipples, such as: Discharge. Bleeding. Dimpling. Redness. A change in position. Feel for Changes Carefully feel your breasts for lumps and changes. It is best to do this while lying on your back on the floor and again while sitting or standing in the shower or tub with soapy water on your skin. Feel each breast in the following way: Place the arm on the side of the breast you are examining above your head. Feel your breast with the other hand. Start in the nipple area and make  inch (2 cm) overlapping circles to feel your breast. Use the pads of your three middle fingers to do this. Apply light pressure, then medium pressure, then firm pressure. The light pressure will allow you to feel the tissue closest to the skin. The medium pressure will allow you to feel the tissue that is a little deeper. The firm pressure will allow you to feel the tissue close to the ribs. Continue the overlapping circles,  moving downward over the breast until you feel your ribs below your breast. Move one finger-width toward the center of the body. Continue to use the  inch (2 cm) overlapping circles to feel your breast as you move slowly up toward your collarbone. Continue the up and down exam using all three pressures until you reach your armpit.  Write Down What You Find  Write down what is normal for each breast and any changes that you find. Keep a written record with breast changes or normal findings for each breast. By writing this information down, you do not need to depend only on memory for size, tenderness, or location. Write down where you are in your menstrual cycle, if you are still menstruating. If you are having trouble noticing differences in your breasts, do not get discouraged. With time you will become more familiar with the variations in your breasts and more comfortable with the exam. How often should I examine my breasts? Examine your breasts every month. If you are breastfeeding, the best time to examine your breasts is after a feeding or after using a breast pump. If you menstruate, the best time to examine your breasts is 5-7 days after your period is over. During your period, your breasts  are lumpier, and it may be more difficult to notice changes. When should I see my health care provider? See your health care provider if you notice: A change in shape or size of your breasts or nipples. A change in the skin of your breast or nipples, such as a reddened or scaly area. Unusual discharge from your nipples. A lump or thick area that was not there before. Pain in your breasts. Anything that concerns you.

## 2023-02-27 LAB — CYTOLOGY - PAP
Comment: NEGATIVE
Diagnosis: NEGATIVE
High risk HPV: NEGATIVE

## 2023-03-05 DIAGNOSIS — S42255D Nondisplaced fracture of greater tuberosity of left humerus, subsequent encounter for fracture with routine healing: Secondary | ICD-10-CM | POA: Diagnosis not present

## 2023-03-28 DIAGNOSIS — M25612 Stiffness of left shoulder, not elsewhere classified: Secondary | ICD-10-CM | POA: Diagnosis not present

## 2023-03-28 DIAGNOSIS — M6281 Muscle weakness (generalized): Secondary | ICD-10-CM | POA: Diagnosis not present

## 2023-03-28 DIAGNOSIS — Z736 Limitation of activities due to disability: Secondary | ICD-10-CM | POA: Diagnosis not present

## 2023-04-15 DIAGNOSIS — M6281 Muscle weakness (generalized): Secondary | ICD-10-CM | POA: Diagnosis not present

## 2023-04-15 DIAGNOSIS — M25612 Stiffness of left shoulder, not elsewhere classified: Secondary | ICD-10-CM | POA: Diagnosis not present

## 2023-04-15 DIAGNOSIS — Z736 Limitation of activities due to disability: Secondary | ICD-10-CM | POA: Diagnosis not present

## 2023-04-16 DIAGNOSIS — S42255D Nondisplaced fracture of greater tuberosity of left humerus, subsequent encounter for fracture with routine healing: Secondary | ICD-10-CM | POA: Diagnosis not present

## 2023-05-19 ENCOUNTER — Telehealth: Payer: Self-pay

## 2023-05-19 DIAGNOSIS — M810 Age-related osteoporosis without current pathological fracture: Secondary | ICD-10-CM

## 2023-05-19 NOTE — Telephone Encounter (Signed)
Former Monique Brady pt reports that her next reclast infusion is due in 06/2023.   Please initiate process.

## 2023-05-19 NOTE — Telephone Encounter (Signed)
Mychart msg returned unread.  Spoke w/ pt on the phone and she reported that she actually received Dr. Salli Quarry msg via result note from 01/2023. Will close this encounter.

## 2023-05-27 NOTE — Telephone Encounter (Addendum)
Reclast instructions   Annual Exam (1 year) ? 02-25-23 JJ   Called pt to verify insurance coverage / inform pt Reclast is in Process ? yes  Labs must be in 30 days window Serum Creatinine DATE? 07-15-23 0.89 Serum Calcium DATE? 07-15-23 9.9   PA needed? No   Cost for Pt? 50% coinsurance once deductible met. Deductible: $7000 ($952.84 met)  Patient responsibility: $24.73  Order form filled out and faxed  w/MD sig? yes  Infusion will be done at ? [facility]   Pt aware? yes  Instructions mailed to pt? yes   Spoke with [facility], Customer Service Rep, who provided benefits as seen above. Reference #: [id number].

## 2023-05-27 NOTE — Telephone Encounter (Signed)
Routing to provider for review.   Call to patient and reviewed benefits. RN advised Nursing Supervisor would reach out to scheduling to see exact cost of Reclast. RN advised would update patient with information once received. Patient agreeable.

## 2023-07-01 NOTE — Telephone Encounter (Signed)
Reviewed benefits with Physiological scientist, Miranda, patient will owe full allowable of Reclast. J3489 =$59.40, 96365= 190.33 Total= $249.73.  Call to patient. Benefits reviewed. Patient scheduled for lab work on 07-14-23 at 1340. Declined earlier appointments. Orders placed for labs.

## 2023-07-14 ENCOUNTER — Other Ambulatory Visit: Payer: BC Managed Care – PPO

## 2023-07-14 DIAGNOSIS — M810 Age-related osteoporosis without current pathological fracture: Secondary | ICD-10-CM

## 2023-07-15 LAB — CALCIUM: Calcium: 9.9 mg/dL (ref 8.6–10.4)

## 2023-07-15 LAB — CREATININE, SERUM: Creat: 0.89 mg/dL (ref 0.50–1.05)

## 2023-07-22 ENCOUNTER — Telehealth: Payer: Self-pay | Admitting: *Deleted

## 2023-07-22 NOTE — Telephone Encounter (Signed)
Infusion order for reclast faxed to [facility] at [phone]. Infusion scheduled for 07-29-23 at 1300.   Detailed message left for patient advising of infusion date and time. Instructions left for patient on voicemail and advised would mail a copy. Patient advised to return call to office if any additional questions.   Encounter closed.

## 2023-07-22 NOTE — Telephone Encounter (Signed)
error 

## 2023-07-22 NOTE — Telephone Encounter (Signed)
Monique Salles, MD 07/16/2023  8:31 AM EDT     Ok to proceed with Reclast.   Hi [name],   Your creatinine and calcium are normal.   It is ok for you to proceed with your Reclast infusion.   Please contact the office for any questions.   Have a good day!   Conley Simmonds, MD

## 2023-07-22 NOTE — Telephone Encounter (Deleted)
-----   Message from Nurse Chapman Fitch sent at 07/17/2023  2:04 PM EDT ----- Seen by patient Monique Brady on 07/16/2023 2:23 PM  Routing to Sharlett Iles

## 2023-07-29 ENCOUNTER — Encounter (HOSPITAL_COMMUNITY): Payer: BC Managed Care – PPO

## 2023-08-01 ENCOUNTER — Other Ambulatory Visit (HOSPITAL_COMMUNITY): Payer: Self-pay

## 2023-08-04 ENCOUNTER — Ambulatory Visit (HOSPITAL_COMMUNITY)
Admission: RE | Admit: 2023-08-04 | Discharge: 2023-08-04 | Disposition: A | Discharge status: Home / Self Care | Payer: BC Managed Care – PPO | Source: Ambulatory Visit | Attending: Obstetrics and Gynecology | Admitting: Obstetrics and Gynecology

## 2023-08-04 DIAGNOSIS — M810 Age-related osteoporosis without current pathological fracture: Secondary | ICD-10-CM | POA: Insufficient documentation

## 2023-08-04 MED ORDER — ZOLEDRONIC ACID 5 MG/100ML IV SOLN: 5.0000 mg | Freq: Once | INTRAVENOUS | Status: AC

## 2023-08-04 MED ORDER — ZOLEDRONIC ACID 5 MG/100ML IV SOLN
INTRAVENOUS | Status: AC
  Administered 2023-08-04: 5 mg via INTRAVENOUS
  Filled 2023-08-04: qty 100

## 2023-09-08 DIAGNOSIS — D0462 Carcinoma in situ of skin of left upper limb, including shoulder: Secondary | ICD-10-CM | POA: Diagnosis not present

## 2023-09-08 DIAGNOSIS — C44612 Basal cell carcinoma of skin of right upper limb, including shoulder: Secondary | ICD-10-CM | POA: Diagnosis not present

## 2023-09-08 DIAGNOSIS — L821 Other seborrheic keratosis: Secondary | ICD-10-CM | POA: Diagnosis not present

## 2023-09-08 DIAGNOSIS — L813 Cafe au lait spots: Secondary | ICD-10-CM | POA: Diagnosis not present

## 2023-09-08 DIAGNOSIS — L723 Sebaceous cyst: Secondary | ICD-10-CM | POA: Diagnosis not present

## 2023-09-08 DIAGNOSIS — L570 Actinic keratosis: Secondary | ICD-10-CM | POA: Diagnosis not present

## 2023-09-08 DIAGNOSIS — Z85828 Personal history of other malignant neoplasm of skin: Secondary | ICD-10-CM | POA: Diagnosis not present

## 2023-10-29 DIAGNOSIS — L720 Epidermal cyst: Secondary | ICD-10-CM | POA: Diagnosis not present

## 2023-10-30 ENCOUNTER — Other Ambulatory Visit: Payer: Self-pay | Admitting: Internal Medicine

## 2023-10-30 DIAGNOSIS — Z1231 Encounter for screening mammogram for malignant neoplasm of breast: Secondary | ICD-10-CM

## 2023-12-01 DIAGNOSIS — M810 Age-related osteoporosis without current pathological fracture: Secondary | ICD-10-CM | POA: Diagnosis not present

## 2023-12-01 DIAGNOSIS — E785 Hyperlipidemia, unspecified: Secondary | ICD-10-CM | POA: Diagnosis not present

## 2023-12-08 DIAGNOSIS — I10 Essential (primary) hypertension: Secondary | ICD-10-CM | POA: Diagnosis not present

## 2023-12-08 DIAGNOSIS — Z23 Encounter for immunization: Secondary | ICD-10-CM | POA: Diagnosis not present

## 2023-12-08 DIAGNOSIS — Z0000 Encounter for general adult medical examination without abnormal findings: Secondary | ICD-10-CM | POA: Diagnosis not present

## 2023-12-08 DIAGNOSIS — Z1339 Encounter for screening examination for other mental health and behavioral disorders: Secondary | ICD-10-CM | POA: Diagnosis not present

## 2023-12-08 DIAGNOSIS — R82998 Other abnormal findings in urine: Secondary | ICD-10-CM | POA: Diagnosis not present

## 2023-12-08 DIAGNOSIS — Z1331 Encounter for screening for depression: Secondary | ICD-10-CM | POA: Diagnosis not present

## 2023-12-09 ENCOUNTER — Ambulatory Visit
Admission: RE | Admit: 2023-12-09 | Discharge: 2023-12-09 | Disposition: A | Discharge status: Home / Self Care | Payer: BC Managed Care – PPO | Source: Ambulatory Visit | Attending: Internal Medicine | Admitting: Internal Medicine

## 2023-12-09 DIAGNOSIS — Z1231 Encounter for screening mammogram for malignant neoplasm of breast: Secondary | ICD-10-CM

## 2023-12-23 NOTE — Progress Notes (Deleted)
 GYNECOLOGY  VISIT   HPI: 63 y.o.   Married  Caucasian female   G0P0000 with Patient's last menstrual period was 09/30/2002.   here for: PMB     GYNECOLOGIC HISTORY: Patient's last menstrual period was 09/30/2002. Contraception:  PMP Menopausal hormone therapy:  n/a Last 2 paps:  02/25/23 neg: HR HPV neg, 11/02/18 neg: HR HPV neg History of abnormal Pap or positive HPV:  no Mammogram:  12/09/23 Breast Density Cat B, BI-RADS CAT 1 neg        OB History     Gravida  0   Para  0   Term  0   Preterm  0   AB  0   Living  0      SAB  0   IAB  0   Ectopic  0   Multiple  0   Live Births           Obstetric Comments  2 adopted girls            Patient Active Problem List   Diagnosis Date Noted   Pain in joint of left shoulder 02/04/2023   Low back pain 11/06/2018   Essential hypertension, benign 07/14/2013   Osteoporosis 07/14/2013   Age-related osteoporosis without current pathological fracture 08/07/2012   Hyperlipidemia 08/07/2012   Benign neoplasm of colon 02/05/2012   Glaucoma 02/05/2012    Past Medical History:  Diagnosis Date   Endometriosis    Hypertension    Infertility, female    Osteoporosis     Past Surgical History:  Procedure Laterality Date   LAPAROSCOPY     TONSILLECTOMY     as a child     Current Outpatient Medications  Medication Sig Dispense Refill   amLODipine (NORVASC) 2.5 MG tablet daily.      calcium-vitamin D (OSCAL WITH D) 500-5 MG-MCG tablet Take 1 tablet by mouth.     cholecalciferol (VITAMIN D) 1000 units tablet Take 1,000 Units by mouth daily.     losartan-hydrochlorothiazide (HYZAAR) 100-12.5 MG per tablet Take 1 tablet by mouth daily.      Multiple Vitamins-Minerals (MULTIVITAMIN ADULT PO) Take by mouth daily.     naproxen sodium (ALEVE) 220 MG tablet Take 220 mg by mouth.     rosuvastatin (CRESTOR) 10 MG tablet Take 10 mg by mouth daily.     TRANSDERM-SCOP 1.5 MG 1 patch as needed.      zolpidem (AMBIEN) 10  MG tablet Take 10 mg by mouth as needed.      No current facility-administered medications for this visit.     ALLERGIES: Patient has no known allergies.  Family History  Problem Relation Age of Onset   Hypertension Mother    Hypertension Father    Stroke Father    Hypertension Brother    Hypertension Brother    Hypertension Brother     Social History   Socioeconomic History   Marital status: Married    Spouse name: Not on file   Number of children: Not on file   Years of education: Not on file   Highest education level: Not on file  Occupational History   Not on file  Tobacco Use   Smoking status: Former   Smokeless tobacco: Never  Vaping Use   Vaping status: Never Used  Substance and Sexual Activity   Alcohol use: Yes    Alcohol/week: 7.0 standard drinks of alcohol    Types: 7 Standard drinks or equivalent per week  Drug use: No   Sexual activity: Yes    Partners: Male    Birth control/protection: Post-menopausal  Other Topics Concern   Not on file  Social History Narrative   Not on file   Social Drivers of Health   Financial Resource Strain: Not on file  Food Insecurity: Not on file  Transportation Needs: Not on file  Physical Activity: Not on file  Stress: Not on file  Social Connections: Not on file  Intimate Partner Violence: Not on file    Review of Systems  PHYSICAL EXAMINATION:   LMP 09/30/2002     General appearance: alert, cooperative and appears stated age Head: Normocephalic, without obvious abnormality, atraumatic Neck: no adenopathy, supple, symmetrical, trachea midline and thyroid normal to inspection and palpation Lungs: clear to auscultation bilaterally Breasts: normal appearance, no masses or tenderness, No nipple retraction or dimpling, No nipple discharge or bleeding, No axillary or supraclavicular adenopathy Heart: regular rate and rhythm Abdomen: soft, non-tender, no masses,  no organomegaly Extremities: extremities normal,  atraumatic, no cyanosis or edema Skin: Skin color, texture, turgor normal. No rashes or lesions Lymph nodes: Cervical, supraclavicular, and axillary nodes normal. No abnormal inguinal nodes palpated Neurologic: Grossly normal  Pelvic: External genitalia:  no lesions              Urethra:  normal appearing urethra with no masses, tenderness or lesions              Bartholins and Skenes: normal                 Vagina: normal appearing vagina with normal color and discharge, no lesions              Cervix: no lesions                Bimanual Exam:  Uterus:  normal size, contour, position, consistency, mobility, non-tender              Adnexa: no mass, fullness, tenderness              Rectal exam: {yes no:314532}.  Confirms.              Anus:  normal sphincter tone, no lesions  Chaperone was present for exam:  {BSCHAPERONE:[id number]::"Ellasyn Swilling F, CMA"}  ASSESSMENT:    PLAN:    {LABS (Optional):23779}  ***  total time was spent for this patient encounter, including preparation, face-to-face counseling with the patient, coordination of care, and documentation of the encounter.

## 2024-01-06 ENCOUNTER — Ambulatory Visit: Admitting: Obstetrics and Gynecology

## 2024-01-26 NOTE — Progress Notes (Unsigned)
 GYNECOLOGY  VISIT   HPI: 63 y.o.   Married  Caucasian female   G0P0000 with Patient's last menstrual period was 09/30/2002.   here for: PMB. Pt reports being treated for vaginal atrophy in the past with vaginal estradiol  and then discontinued.  No pain or discomfort.    Went to [place].  Noted blood when she went to the restroom while on the plane. No further bleeding.   Has lived internationally.   GYNECOLOGIC HISTORY: Patient's last menstrual period was 09/30/2002. Contraception: PM Menopausal hormone therapy:  n/a Last 2 paps:  02/25/23 neg: HR HPV neg, 11/02/18 neg: HR HPV neg History of abnormal Pap or positive HPV:  no Mammogram:  12/09/23 Breast Density Cat B, BI-RADS CAT 1 neg        OB History     Gravida  0   Para  0   Term  0   Preterm  0   AB  0   Living  0      SAB  0   IAB  0   Ectopic  0   Multiple  0   Live Births           Obstetric Comments  2 adopted girls            Patient Active Problem List   Diagnosis Date Noted   Pain in joint of left shoulder 02/04/2023   Low back pain 11/06/2018   Essential hypertension, benign 07/14/2013   Osteoporosis 07/14/2013   Age-related osteoporosis without current pathological fracture 08/07/2012   Hyperlipidemia 08/07/2012   Benign neoplasm of colon 02/05/2012   Glaucoma 02/05/2012    Past Medical History:  Diagnosis Date   Endometriosis    Hypertension    Infertility, female    Osteoporosis     Past Surgical History:  Procedure Laterality Date   LAPAROSCOPY     TONSILLECTOMY     as a child     Current Outpatient Medications  Medication Sig Dispense Refill   amLODipine (NORVASC) 2.5 MG tablet daily.      calcium-vitamin D  (OSCAL WITH D) 500-5 MG-MCG tablet Take 1 tablet by mouth.     cholecalciferol (VITAMIN D ) 1000 units tablet Take 1,000 Units by mouth daily.     losartan-hydrochlorothiazide (HYZAAR) 100-12.5 MG per tablet Take 1 tablet by mouth daily.      Multiple  Vitamins-Minerals (MULTIVITAMIN ADULT PO) Take by mouth daily.     naproxen sodium (ALEVE) 220 MG tablet Take 220 mg by mouth.     rosuvastatin (CRESTOR) 10 MG tablet Take 10 mg by mouth daily.     TRANSDERM-SCOP 1.5 MG 1 patch as needed.      zolpidem (AMBIEN) 10 MG tablet Take 10 mg by mouth as needed.      No current facility-administered medications for this visit.     ALLERGIES: Patient has no known allergies.  Family History  Problem Relation Age of Onset   Hypertension Mother    Hypertension Father    Stroke Father    Hypertension Brother    Hypertension Brother    Hypertension Brother     Social History   Socioeconomic History   Marital status: Married    Spouse name: Not on file   Number of children: Not on file   Years of education: Not on file   Highest education level: Not on file  Occupational History   Not on file  Tobacco Use   Smoking status: Former  Smokeless tobacco: Never  Vaping Use   Vaping status: Never Used  Substance and Sexual Activity   Alcohol  use: Yes    Alcohol /week: 7.0 standard drinks of alcohol     Types: 7 Standard drinks or equivalent per week   Drug use: No   Sexual activity: Yes    Partners: Male    Birth control/protection: Post-menopausal  Other Topics Concern   Not on file  Social History Narrative   Not on file   Social Drivers of Health   Financial Resource Strain: Not on file  Food Insecurity: Not on file  Transportation Needs: Not on file  Physical Activity: Not on file  Stress: Not on file  Social Connections: Not on file  Intimate Partner Violence: Not on file    Review of Systems  PHYSICAL EXAMINATION:  Vitals:   01/27/24 1359  BP: 104/72  Pulse: 88  SpO2: 98%     LMP 09/30/2002     General appearance: alert, cooperative and appears stated age  Pelvic: External genitalia:  no lesions              Urethra:  normal appearing urethra with no masses, tenderness or lesions              Bartholins and  Skenes: normal                 Vagina: normal appearing vagina with normal color and discharge, no lesions              Cervix: no lesions.  Pap and HR HPV collected.                 Bimanual Exam:  Uterus:  normal size, contour, position, consistency, mobility, non-tender              Adnexa: no mass, fullness, tenderness    Chaperone was present for exam:  Alesia Husky, CMA  ASSESSMENT:  Postmenopausal bleeding.  Cervical cancer screening.  Vaginal atrophy.   PLAN:  Postmenopausal bleeding discussed including etiologies:  atrophy, polyps, hyperplasia, cancer.  Pap and HR HPV collected.  Return for pelvic US  and possible endometrial biopsy.  We discussed potential tx plans: vaginal estrogen, polyp removal, progesterone treatment, hysterectomy, depending on test results.  Try lubricants, vit E, or cooking oils for vaginal hydration until evaluation is completed.   24 min  total time was spent for this patient encounter, including preparation, face-to-face counseling with the patient, coordination of care, and documentation of the encounter.

## 2024-01-27 ENCOUNTER — Ambulatory Visit (INDEPENDENT_AMBULATORY_CARE_PROVIDER_SITE_OTHER): Admitting: Obstetrics and Gynecology

## 2024-01-27 ENCOUNTER — Other Ambulatory Visit (HOSPITAL_COMMUNITY)
Admission: RE | Admit: 2024-01-27 | Discharge: 2024-01-27 | Disposition: A | Discharge status: Home / Self Care | Source: Ambulatory Visit | Attending: Obstetrics and Gynecology | Admitting: Obstetrics and Gynecology

## 2024-01-27 ENCOUNTER — Encounter: Payer: Self-pay | Admitting: Obstetrics and Gynecology

## 2024-01-27 VITALS — BP 104/72 | HR 88

## 2024-01-27 DIAGNOSIS — N950 Postmenopausal bleeding: Secondary | ICD-10-CM | POA: Insufficient documentation

## 2024-01-27 DIAGNOSIS — Z124 Encounter for screening for malignant neoplasm of cervix: Secondary | ICD-10-CM | POA: Diagnosis not present

## 2024-01-27 NOTE — Patient Instructions (Signed)
 Uterine Bleeding After Menopause: What It Means Menopause is the end of a female's fertile years. After menopause, your ovaries can no longer release an egg. You'll no longer be able to get pregnant, and you'll no longer get a period. Any type of bleeding after menopause should be checked by a health care provider, as this is not normal. Treatment will depend on the cause of the bleeding. What can cause bleeding after menopause? Your provider may do tests to find out why you're bleeding. You may have bleeding if: You take hormones to treat the symptoms of menopause. The lining of your uterus thins as a result of low estrogen. The lining of your uterus thickens as a result of too much estrogen. You have cancer in the uterus. You have growths in the uterus, such as: Polyps. Fibroids. Follow these instructions at home: Pay attention to any changes in your symptoms. Let your provider know about them. If told by your provider: Avoid using tampons and douches. Get regular pelvic exams, including Pap tests. Take iron supplements. Change your pads regularly. Take your medicines only as told. Contact a health care provider if: You have pain in your belly. You have headaches. You have a fever or chills. You feel faint or dizzy. Get help right away if: You pass large blood clots. You have heavy bleeding that needs more than 1 pad an hour and you've never had this before. This information is not intended to replace advice given to you by your health care provider. Make sure you discuss any questions you have with your health care provider. Document Revised: 07/29/2023 Document Reviewed: 05/26/2023 Elsevier Patient Education  2024 [employer].

## 2024-02-01 LAB — CYTOLOGY - PAP
Comment: NEGATIVE
Diagnosis: NEGATIVE
High risk HPV: NEGATIVE

## 2024-02-03 ENCOUNTER — Encounter: Payer: Self-pay | Admitting: Obstetrics and Gynecology

## 2024-02-18 DIAGNOSIS — H53432 Sector or arcuate defects, left eye: Secondary | ICD-10-CM | POA: Diagnosis not present

## 2024-02-18 DIAGNOSIS — H47232 Glaucomatous optic atrophy, left eye: Secondary | ICD-10-CM | POA: Diagnosis not present

## 2024-02-18 DIAGNOSIS — H402231 Chronic angle-closure glaucoma, bilateral, mild stage: Secondary | ICD-10-CM | POA: Diagnosis not present

## 2024-02-18 DIAGNOSIS — H40033 Anatomical narrow angle, bilateral: Secondary | ICD-10-CM | POA: Diagnosis not present

## 2024-03-02 ENCOUNTER — Encounter: Payer: Self-pay | Admitting: Obstetrics and Gynecology

## 2024-03-02 ENCOUNTER — Ambulatory Visit

## 2024-03-02 ENCOUNTER — Ambulatory Visit (INDEPENDENT_AMBULATORY_CARE_PROVIDER_SITE_OTHER): Admitting: Obstetrics and Gynecology

## 2024-03-02 VITALS — BP 134/84 | HR 63

## 2024-03-02 DIAGNOSIS — N950 Postmenopausal bleeding: Secondary | ICD-10-CM

## 2024-03-02 NOTE — Progress Notes (Addendum)
 GYNECOLOGY  VISIT   HPI: 63 y.o.   Married  Caucasian female   G0P0000 with Patient's last menstrual period was 09/30/2002.   here for: u/s consult & possible endo biopsy for postmenopausal bleeding on one occasion.  No further bleeding.   Not on HRT. Used vaginal estradiol  tablets in the past.   GYNECOLOGIC HISTORY: Patient's last menstrual period was 09/30/2002. Contraception:  PMP Menopausal hormone therapy:  n/a Last 2 paps:  01/27/24 neg HR HPV neg, 02/25/23 neg HR HPV neg History of abnormal Pap or positive HPV:  no Mammogram:  12/09/23 Breast Density Cat B, BIRADS cat 1 neg         OB History     Gravida  0   Para  0   Term  0   Preterm  0   AB  0   Living  0      SAB  0   IAB  0   Ectopic  0   Multiple  0   Live Births           Obstetric Comments  2 adopted girls            Patient Active Problem List   Diagnosis Date Noted   Pain in joint of left shoulder 02/04/2023   Low back pain 11/06/2018   Essential hypertension, benign 07/14/2013   Osteoporosis 07/14/2013   Age-related osteoporosis without current pathological fracture 08/07/2012   Hyperlipidemia 08/07/2012   Benign neoplasm of colon 02/05/2012   Glaucoma 02/05/2012    Past Medical History:  Diagnosis Date   Endometriosis    Hypertension    Infertility, female    Osteoporosis     Past Surgical History:  Procedure Laterality Date   LAPAROSCOPY     TONSILLECTOMY     as a child     Current Outpatient Medications  Medication Sig Dispense Refill   amLODipine (NORVASC) 2.5 MG tablet daily.      calcium-vitamin D  (OSCAL WITH D) 500-5 MG-MCG tablet Take 1 tablet by mouth.     cholecalciferol (VITAMIN D ) 1000 units tablet Take 1,000 Units by mouth daily.     losartan-hydrochlorothiazide (HYZAAR) 100-12.5 MG per tablet Take 1 tablet by mouth daily.      Multiple Vitamins-Minerals (MULTIVITAMIN ADULT PO) Take by mouth daily.     naproxen sodium (ALEVE) 220 MG tablet Take 220  mg by mouth.     rosuvastatin (CRESTOR) 10 MG tablet Take 10 mg by mouth daily.     TRANSDERM-SCOP 1.5 MG 1 patch as needed.      zolpidem (AMBIEN) 10 MG tablet Take 10 mg by mouth as needed.     No current facility-administered medications for this visit.     ALLERGIES: Patient has no known allergies.  Family History  Problem Relation Age of Onset   Hypertension Mother    Hypertension Father    Stroke Father    Hypertension Brother    Hypertension Brother    Hypertension Brother     Social History   Socioeconomic History   Marital status: Married    Spouse name: Not on file   Number of children: Not on file   Years of education: Not on file   Highest education level: Not on file  Occupational History   Not on file  Tobacco Use   Smoking status: Former   Smokeless tobacco: Never  Vaping Use   Vaping status: Never Used  Substance and Sexual Activity  Alcohol  use: Yes    Alcohol /week: 7.0 standard drinks of alcohol     Types: 7 Standard drinks or equivalent per week   Drug use: No   Sexual activity: Yes    Partners: Male    Birth control/protection: Post-menopausal  Other Topics Concern   Not on file  Social History Narrative   Not on file   Social Drivers of Health   Financial Resource Strain: Not on file  Food Insecurity: Not on file  Transportation Needs: Not on file  Physical Activity: Not on file  Stress: Not on file  Social Connections: Not on file  Intimate Partner Violence: Not on file    Review of Systems  See HPI.  PHYSICAL EXAMINATION:   BP 134/84 (BP Location: Left Arm, Patient Position: Sitting)   Pulse 63   LMP 09/30/2002   SpO2 98%     General appearance: alert, cooperative and appears stated age   Pelvic US  Uterus 3.63 x 2.50 x 2.01 cm.  No myometrial masses. EMS 3.38 mm.  Thin and symmetrical.  Avascular.  Cervix with possible avascular area 9.9 x 4.5 mm.  Left ovary 1.72 x 1.59 x 1.35 cm.  Atrophic. Right ovary 1.49 x 1.48 x  1.47 cm.  Atrophic.  No adnexal masses.  No free fluid.  ASSESSMENT:  Postmenopausal bleeding, resolved.  I suspect atrophy is the cause.  Normal pelvic ultrasound.  PLAN:  US  images and report reviewed.  No endometrial biopsy needed at this time.  If recurrent bleeding occurs, would then to endometrial biopsy.  Patient declines tx for potential atrophy.   Keep annual exam appointment in July.   17 min  total time was spent for this patient encounter, including preparation, face-to-face counseling with the patient, coordination of care, and documentation of the encounter.

## 2024-04-08 ENCOUNTER — Ambulatory Visit: Admitting: Obstetrics and Gynecology

## 2024-05-13 NOTE — Progress Notes (Unsigned)
 63 y.o. G33P0000 Married Caucasian female here for annual exam.    Postmenopausal bleeding resolved.   Normal pelvic US .  No EMB needed.   She stopped using vaginal estrogen treatment.  Status post Reclast  x 2.  Last dosage was November, 2024.  Osteoporosis of forearm and spine.  PCP: Onita Rush, MD   Patient's last menstrual period was 09/30/2002.           Sexually active: Yes.    The current method of family planning is post menopausal status.    Menopausal hormone therapy:  n/a Exercising: Yes.    Working with trainer, swimming, and walking, bike riding, pickle ball  Smoker:  Former   OB History  Gravida Para Term Preterm AB Living  0 0 0 0 0 0  SAB IAB Ectopic Multiple Live Births  0 0 0 0   Obstetric Comments  2 adopted girls     HEALTH MAINTENANCE: Last 2 paps:  01/27/24 neg HR HPV neg, 02/25/23 neg HR HPV neg  History of abnormal Pap or positive HPV:  no Mammogram:   12/09/23 Breast Density Cat B, BIRADS Cat 1 neg  Colonoscopy:  2023 polyp, needs to f/u 2025.  She knows she is due.  Bone Density:  03/06/22  Result  osteoporotic - forearm and spine.   Immunization History  Administered Date(s) Administered   Influenza Inj Mdck Quad Pf 07/05/2019   Influenza Split 06/03/2011, 06/16/2012, 07/07/2013, 09/30/2013, 06/19/2020   Influenza,inj,Quad PF,6+ Mos 07/07/2013, 07/16/2017, 06/30/2018   Moderna Sars-Covid-2 Vaccination 11/29/2019, 12/27/2019, 08/06/2020, 01/03/2021   Pneumococcal Polysaccharide-23 12/08/2023   Pneumococcal-Unspecified 08/07/2012   Td 02/05/2012   Tdap 08/16/2013, 08/29/2016   Zoster Recombinant(Shingrix) 07/05/2019   Zoster, Live 08/07/2012      reports that she has quit smoking. She has never used smokeless tobacco. She reports current alcohol  use of about 7.0 standard drinks of alcohol  per week. She reports that she does not use drugs.  Past Medical History:  Diagnosis Date   Endometriosis    Hypertension    Infertility, female     Osteoporosis     Past Surgical History:  Procedure Laterality Date   LAPAROSCOPY     TONSILLECTOMY     as a child     Current Outpatient Medications  Medication Sig Dispense Refill   amLODipine (NORVASC) 2.5 MG tablet daily.      calcium-vitamin D  (OSCAL WITH D) 500-5 MG-MCG tablet Take 1 tablet by mouth.     losartan-hydrochlorothiazide (HYZAAR) 100-12.5 MG per tablet Take 1 tablet by mouth daily.      Multiple Vitamins-Minerals (MULTIVITAMIN ADULT PO) Take by mouth daily.     naproxen sodium (ALEVE) 220 MG tablet Take 220 mg by mouth.     rosuvastatin (CRESTOR) 10 MG tablet Take 10 mg by mouth daily.     TRANSDERM-SCOP 1.5 MG 1 patch as needed.      zolpidem (AMBIEN) 10 MG tablet Take 10 mg by mouth as needed.     cholecalciferol (VITAMIN D ) 1000 units tablet Take 1,000 Units by mouth daily. (Patient not taking: Reported on 05/17/2024)     No current facility-administered medications for this visit.    ALLERGIES: Patient has no known allergies.  Family History  Problem Relation Age of Onset   Hypertension Mother    Hypertension Father    Stroke Father    Hypertension Brother    Hypertension Brother    Hypertension Brother     Review of Systems  All  other systems reviewed and are negative.   PHYSICAL EXAM:  BP 116/80 (BP Location: Left Arm, Patient Position: Sitting)   Pulse 70   Ht 5' 2 (1.575 m)   Wt 161 lb (73 kg)   LMP 09/30/2002   SpO2 98%   BMI 29.45 kg/m     General appearance: alert, cooperative and appears stated age Head: normocephalic, without obvious abnormality, atraumatic Neck: no adenopathy, supple, symmetrical, trachea midline and thyroid  normal to inspection and palpation Lungs: clear to auscultation bilaterally Breasts: right - normal appearance, no masses or tenderness, No nipple retraction or dimpling, No nipple discharge or bleeding, No axillary adenopathy Left - normal appearance, no masses or tenderness, nipple retraction present (old  change).  No nipple discharge or bleeding, No axillary adenopathy Heart: regular rate and rhythm Abdomen: soft, non-tender; no masses, no organomegaly Extremities: extremities normal, atraumatic, no cyanosis or edema Skin: skin color, texture, turgor normal. No rashes or lesions Lymph nodes: cervical, supraclavicular, and axillary nodes normal. Neurologic: grossly normal  Pelvic: External genitalia:  no lesions              No abnormal inguinal nodes palpated.              Urethra:  normal appearing urethra with no masses, tenderness or lesions              Bartholins and Skenes: normal                 Vagina: normal appearing vagina with normal color and discharge, no lesions              Cervix: no lesions              Pap taken: no Bimanual Exam:  Uterus:  normal size, contour, position, consistency, mobility, non-tender              Adnexa: no mass, fullness, tenderness              Rectal exam: yes.  Confirms.              Anus:  normal sphincter tone, no lesions  Chaperone was present for exam:  Kari HERO, CMA  ASSESSMENT: Well woman visit with gynecologic exam. Vaginal atrophy.  Osteoporosis.  Forearm and spine.  Status post Reclast  x 2.  Left nipple inversion.  Old change.  PHQ-2-9: 0  PLAN: Mammogram screening discussed. Self breast awareness reviewed. Pap and HRV collected:  no.  Due in 2025.  Guidelines for Calcium, Vitamin D , regular exercise program including cardiovascular and weight bearing exercise. Medication refills:  Reclast  due in November, 2025.  Rx for Vagifem .  Instructed in use.  I discussed potential effect on breast cancer.  BMD ordered for [facility].  Follow up:  yearly and pnr.

## 2024-05-17 ENCOUNTER — Telehealth: Payer: Self-pay | Admitting: Obstetrics and Gynecology

## 2024-05-17 ENCOUNTER — Ambulatory Visit (INDEPENDENT_AMBULATORY_CARE_PROVIDER_SITE_OTHER): Admitting: Obstetrics and Gynecology

## 2024-05-17 ENCOUNTER — Encounter: Payer: Self-pay | Admitting: Obstetrics and Gynecology

## 2024-05-17 VITALS — BP 116/80 | HR 70 | Ht 62.0 in | Wt 161.0 lb

## 2024-05-17 DIAGNOSIS — Z01419 Encounter for gynecological examination (general) (routine) without abnormal findings: Secondary | ICD-10-CM | POA: Diagnosis not present

## 2024-05-17 DIAGNOSIS — Z1331 Encounter for screening for depression: Secondary | ICD-10-CM | POA: Diagnosis not present

## 2024-05-17 DIAGNOSIS — M810 Age-related osteoporosis without current pathological fracture: Secondary | ICD-10-CM | POA: Diagnosis not present

## 2024-05-17 DIAGNOSIS — N952 Postmenopausal atrophic vaginitis: Secondary | ICD-10-CM | POA: Diagnosis not present

## 2024-05-17 MED ORDER — ESTRADIOL 10 MCG VA TABS: 1.0000 | ORAL_TABLET | VAGINAL | 3 refills | Status: AC

## 2024-05-17 NOTE — Patient Instructions (Addendum)
 Please call the [facility] to schedule your bone density test for this year.   EXERCISE AND DIET:  We recommended that you start or continue a regular exercise program for good health. Regular exercise means any activity that makes your heart beat faster and makes you sweat.  We recommend exercising at least 30 minutes per day at least 3 days a week, preferably 4 or 5.  We also recommend a diet low in fat and sugar.  Inactivity, poor dietary choices and obesity can cause diabetes, heart attack, stroke, and kidney damage, among others.    ALCOHOL  AND SMOKING:  Women should limit their alcohol  intake to no more than 7 drinks/beers/glasses of wine (combined, not each!) per week. Moderation of alcohol  intake to this level decreases your risk of breast cancer and liver damage. And of course, no recreational drugs are part of a healthy lifestyle.  And absolutely no smoking or even second hand smoke. Most people know smoking can cause heart and lung diseases, but did you know it also contributes to weakening of your bones? Aging of your skin?  Yellowing of your teeth and nails?  CALCIUM AND VITAMIN D :  Adequate intake of calcium and Vitamin D  are recommended.  The recommendations for exact amounts of these supplements seem to change often, but generally speaking 600 mg of calcium (either carbonate or citrate) and 800 units of Vitamin D  per day seems prudent. Certain women may benefit from higher intake of Vitamin D .  If you are among these women, your doctor will have told you during your visit.    PAP SMEARS:  Pap smears, to check for cervical cancer or precancers,  have traditionally been done yearly, although recent scientific advances have shown that most women can have pap smears less often.  However, every woman still should have a physical exam from her gynecologist every year. It will include a breast check, inspection of the vulva and vagina to check for abnormal growths or skin  changes, a visual exam of the cervix, and then an exam to evaluate the size and shape of the uterus and ovaries.  And after 64 years of age, a rectal exam is indicated to check for rectal cancers. We will also provide age appropriate advice regarding health maintenance, like when you should have certain vaccines, screening for sexually transmitted diseases, bone density testing, colonoscopy, mammograms, etc.   MAMMOGRAMS:  All women over 64 years old should have a yearly mammogram. Many facilities now offer a 3D mammogram, which may cost around $50 extra out of pocket. If possible,  we recommend you accept the option to have the 3D mammogram performed.  It both reduces the number of women who will be called back for extra views which then turn out to be normal, and it is better than the routine mammogram at detecting truly abnormal areas.    COLONOSCOPY:  Colonoscopy to screen for colon cancer is recommended for all women at age 64.  We know, you hate the idea of the prep.  We agree, BUT, having colon cancer and not knowing it is worse!!  Colon cancer so often starts as a polyp that can be seen and removed at colonscopy, which can quite literally save your life!  And if your first colonoscopy is normal and you have no family history of colon cancer, most women don't have to have it again for 10 years.  Once every ten years, you can do something that may end  up saving your life, right?  We will be happy to help you get it scheduled when you are ready.  Be sure to check your insurance coverage so you understand how much it will cost.  It may be covered as a preventative service at no cost, but you should check your particular policy.

## 2024-05-17 NOTE — Telephone Encounter (Signed)
 Patient is due for Reclast  in November, 2025.  Please facilitate this.

## 2024-06-02 NOTE — Telephone Encounter (Signed)
 Patient in Reclast  file for November.

## 2024-06-21 ENCOUNTER — Ambulatory Visit (HOSPITAL_BASED_OUTPATIENT_CLINIC_OR_DEPARTMENT_OTHER)
Admission: RE | Admit: 2024-06-21 | Discharge: 2024-06-21 | Disposition: A | Discharge status: Home / Self Care | Source: Ambulatory Visit | Attending: Obstetrics and Gynecology | Admitting: Obstetrics and Gynecology

## 2024-06-21 DIAGNOSIS — M810 Age-related osteoporosis without current pathological fracture: Secondary | ICD-10-CM | POA: Diagnosis not present

## 2024-06-21 DIAGNOSIS — Z780 Asymptomatic menopausal state: Secondary | ICD-10-CM | POA: Diagnosis not present

## 2024-06-27 ENCOUNTER — Encounter: Payer: Self-pay | Admitting: Obstetrics and Gynecology

## 2024-06-27 ENCOUNTER — Ambulatory Visit: Payer: Self-pay | Admitting: Obstetrics and Gynecology

## 2024-06-28 ENCOUNTER — Other Ambulatory Visit: Payer: Self-pay | Admitting: *Deleted

## 2024-06-28 ENCOUNTER — Telehealth (HOSPITAL_COMMUNITY): Payer: Self-pay | Admitting: Pharmacy Technician

## 2024-06-28 ENCOUNTER — Other Ambulatory Visit (HOSPITAL_COMMUNITY): Payer: Self-pay | Admitting: Pharmacy Technician

## 2024-06-28 ENCOUNTER — Telehealth: Payer: Self-pay | Admitting: *Deleted

## 2024-06-28 DIAGNOSIS — M810 Age-related osteoporosis without current pathological fracture: Secondary | ICD-10-CM

## 2024-06-28 NOTE — Telephone Encounter (Signed)
 Message left to return call to [facility] at [phone].   Reclast  plan created to start 08/04/24. Patient needs to schedule labs for calcium and creatinine for end of October/beginning of November.

## 2024-06-28 NOTE — Telephone Encounter (Signed)
 Auth Submission: NO AUTH NEEDED Site of care: [facility] Payer: [employer]BS Medication & CPT/J Code(s) submitted: Reclast  (Zolendronic acid) J3489 Diagnosis Code: M81.0 Route of submission (phone, fax, portal):  Phone # Fax # Auth type: Buy/Bill HB Units/visits requested: 5mg  x 1 dose, q 12 months Reference number:  Approval from: 06/28/24 to [id number]    Dagoberto Armour, CPhT [facility] Phone: [phone] 06/28/24

## 2024-06-28 NOTE — Telephone Encounter (Signed)
 Labs scheduled for 07/23/24... patient aware infusion needs to be scheduled within 30 days of labs. Aware [facility] will reach out with benefits for Reclast  and scheduling.   Routing to provider to review Reclast  plan.

## 2024-06-29 NOTE — Telephone Encounter (Signed)
 Patient is due in November, 2025 for her next Reclast .  Please let me know if there is anything I need to do to assist with this plan.

## 2024-06-29 NOTE — Telephone Encounter (Signed)
 Encounter closed. See Reclast  therapy ordered for November.

## 2024-07-05 NOTE — Telephone Encounter (Signed)
 Patient scheduled for Reclast  on 08/05/24.

## 2024-07-23 ENCOUNTER — Other Ambulatory Visit

## 2024-07-23 DIAGNOSIS — M810 Age-related osteoporosis without current pathological fracture: Secondary | ICD-10-CM | POA: Diagnosis not present

## 2024-07-23 LAB — CALCIUM: Calcium: 9.9 mg/dL (ref 8.6–10.4)

## 2024-07-24 LAB — CREATININE, SERUM: Creat: 0.6 mg/dL (ref 0.50–1.05)

## 2024-07-26 ENCOUNTER — Ambulatory Visit: Payer: Self-pay | Admitting: Obstetrics and Gynecology

## 2024-08-05 ENCOUNTER — Ambulatory Visit (HOSPITAL_COMMUNITY)
Admission: RE | Admit: 2024-08-05 | Discharge: 2024-08-05 | Disposition: A | Discharge status: Home / Self Care | Source: Ambulatory Visit | Attending: Obstetrics and Gynecology | Admitting: Obstetrics and Gynecology

## 2024-08-05 VITALS — BP 142/79 | HR 69 | Temp 98.2°F | Resp 16

## 2024-08-05 DIAGNOSIS — M810 Age-related osteoporosis without current pathological fracture: Secondary | ICD-10-CM | POA: Insufficient documentation

## 2024-08-05 MED ORDER — ZOLEDRONIC ACID 5 MG/100ML IV SOLN
5.0000 mg | Freq: Once | INTRAVENOUS | Status: AC
  Administered 2024-08-05: 5 mg via INTRAVENOUS

## 2024-08-05 MED ORDER — DIPHENHYDRAMINE HCL 25 MG PO CAPS: 25.0000 mg | ORAL_CAPSULE | Freq: Once | ORAL | Status: DC

## 2024-08-05 MED ORDER — ZOLEDRONIC ACID 5 MG/100ML IV SOLN
INTRAVENOUS | Status: AC
  Filled 2024-08-05: qty 100

## 2024-08-05 MED ORDER — SODIUM CHLORIDE 0.9 % IV SOLN: INTRAVENOUS | Status: DC

## 2024-08-05 MED ORDER — ACETAMINOPHEN 325 MG PO TABS: 650.0000 mg | ORAL_TABLET | Freq: Once | ORAL | Status: DC
